# Patient Record
Sex: Male | Born: 1945
Health system: Southern US, Community
[De-identification: ages and names within clinical notes are randomized; demographics above are authoritative.]

## PROBLEM LIST (undated history)

## (undated) DIAGNOSIS — T7840XA Allergy, unspecified, initial encounter: Secondary | ICD-10-CM

## (undated) DIAGNOSIS — I639 Cerebral infarction, unspecified: Secondary | ICD-10-CM

## (undated) DIAGNOSIS — H409 Unspecified glaucoma: Secondary | ICD-10-CM

## (undated) DIAGNOSIS — F329 Major depressive disorder, single episode, unspecified: Secondary | ICD-10-CM

## (undated) DIAGNOSIS — I10 Essential (primary) hypertension: Secondary | ICD-10-CM

## (undated) DIAGNOSIS — F32A Depression, unspecified: Secondary | ICD-10-CM

## (undated) DIAGNOSIS — E119 Type 2 diabetes mellitus without complications: Secondary | ICD-10-CM

## (undated) DIAGNOSIS — M1990 Unspecified osteoarthritis, unspecified site: Secondary | ICD-10-CM

## (undated) HISTORY — PX: TONSILLECTOMY: SUR1361

## (undated) HISTORY — DX: Allergy, unspecified, initial encounter: T78.40XA

## (undated) HISTORY — DX: Depression, unspecified: F32.A

## (undated) HISTORY — DX: Major depressive disorder, single episode, unspecified: F32.9

## (undated) HISTORY — DX: Unspecified glaucoma: H40.9

## (undated) HISTORY — DX: Unspecified osteoarthritis, unspecified site: M19.90

---

## 2012-10-07 ENCOUNTER — Encounter (HOSPITAL_COMMUNITY): Payer: Self-pay | Admitting: *Deleted

## 2012-10-07 ENCOUNTER — Emergency Department (HOSPITAL_COMMUNITY)
Admission: EM | Admit: 2012-10-07 | Discharge: 2012-10-07 | Disposition: A | Discharge status: Home / Self Care | Payer: Medicare Other | Attending: Emergency Medicine | Admitting: Emergency Medicine

## 2012-10-07 DIAGNOSIS — L608 Other nail disorders: Secondary | ICD-10-CM | POA: Insufficient documentation

## 2012-10-07 DIAGNOSIS — Z79899 Other long term (current) drug therapy: Secondary | ICD-10-CM | POA: Insufficient documentation

## 2012-10-07 DIAGNOSIS — Z7982 Long term (current) use of aspirin: Secondary | ICD-10-CM | POA: Insufficient documentation

## 2012-10-07 DIAGNOSIS — Z760 Encounter for issue of repeat prescription: Secondary | ICD-10-CM

## 2012-10-07 DIAGNOSIS — B351 Tinea unguium: Secondary | ICD-10-CM | POA: Insufficient documentation

## 2012-10-07 HISTORY — DX: Type 2 diabetes mellitus without complications: E11.9

## 2012-10-07 HISTORY — DX: Essential (primary) hypertension: I10

## 2012-10-07 HISTORY — DX: Cerebral infarction, unspecified: I63.9

## 2012-10-07 MED ORDER — LISINOPRIL 40 MG PO TABS: 40.0000 mg | ORAL_TABLET | Freq: Every day | ORAL | Status: DC

## 2012-10-07 MED ORDER — PAROXETINE HCL 20 MG PO TABS: 20.0000 mg | ORAL_TABLET | ORAL | Status: DC

## 2012-10-07 NOTE — ED Notes (Signed)
MD at bedside. 

## 2012-10-07 NOTE — ED Provider Notes (Signed)
History     CSN: [id number]  Arrival date & time 10/07/12  1106   First MD Initiated Contact with Patient 10/07/12 1121      No chief complaint on file.   (Consider location/radiation/quality/duration/timing/severity/associated sxs/prior treatment) HPI  66 year old male presents to the ED requesting for medication refill. Patient reports she lives down in [place], recently lost his house and has to move to [place] to live with his grandchildren.  For the past week he has been out of his regular medication, lisinopril 40 mg daily and Paxil 20 mg daily. Patient states he does notice a change in his personality however denies any SI/HI, or hallucination the patient has no other complaints physically no headache, no chest pain, shortness of breath, abdominal pain, numbness or weakness.  Patient also requests to have his toenails trim because it has been hurting him whenever he walked. He has not been able to trim his nails for many years. he is a borderline diabetic and takes no medication.  No past medical history on file.  No past surgical history on file.  No family history on file.  History  Substance Use Topics  . Smoking status: Not on file  . Smokeless tobacco: Not on file  . Alcohol Use: Not on file      Review of Systems  Constitutional: Negative for fever.  Respiratory: Negative for shortness of breath.   Cardiovascular: Negative for chest pain.  Skin: Negative for rash.  Neurological: Negative for headaches.  Psychiatric/Behavioral: Negative for suicidal ideas, hallucinations and confusion.    Allergies  Review of patient's allergies indicates no known allergies.  Home Medications   Current Outpatient Rx  Name  Route  Sig  Dispense  Refill  . ASPIRIN 81 MG PO TABS   Oral   Take 81 mg by mouth daily.         Marland Kitchen LISINOPRIL 40 MG PO TABS   Oral   Take 40 mg by mouth daily.         Marland Kitchen PAROXETINE HCL 20 MG PO TABS   Oral   Take 20 mg by mouth  every morning.           There were no vitals taken for this visit.  Physical Exam  Nursing note and vitals reviewed. Constitutional: He appears well-developed and well-nourished. No distress.       Awake, alert, nontoxic appearance  HENT:  Head: Atraumatic.  Eyes: Conjunctivae normal are normal. Right eye exhibits no discharge. Left eye exhibits no discharge.  Neck: Normal range of motion. Neck supple.  Cardiovascular: Normal rate and regular rhythm.   Pulmonary/Chest: Effort normal. No respiratory distress. He exhibits no tenderness.  Abdominal: Soft. There is no tenderness. There is no rebound.  Musculoskeletal: He exhibits no tenderness.       ROM appears intact, no obvious focal weakness  Toenails unkept, long, evidence of fungal infection.    Neurological: He is alert.  Skin: Skin is warm and dry. No rash noted.  Psychiatric: He has a normal mood and affect.    ED Course  Procedures (including critical care time)  Labs Reviewed - No data to display No results found.   No diagnosis found.  1. Medication refill 2. Trim toenails.   MDM   Pt is here for medication refill.  Will give resource for local doctor.  Will refill lisinopril and paxil.    Toenails trimmed here by me using rib cutter and wire cutter as tool. Estimate procedure  time: .  Pt tolerates procedure without difficulty.  Will refer to Foot specialist for further care.  Care discussed with my attending.    12:17 PM BP is elevated today, 2/2 not taking his HTN meds for 1 week.  Pt to resume his meds treatment.  Resource given.    BP 187/98  Pulse 82  Temp 98.7 F (37.1 C) (Oral)  Resp 18  SpO2 100%  I have reviewed nursing notes and vital signs.  I reviewed available ER/hospitalization records thought the EMR         Fayrene Helper, [place] 10/07/12 1218

## 2012-10-07 NOTE — ED Provider Notes (Signed)
Medical screening examination/treatment/procedure(s) were performed by non-physician practitioner and as supervising physician I was immediately available for consultation/collaboration.   Lyanne Co, MD 10/07/12 [phone]

## 2012-10-07 NOTE — ED Notes (Signed)
Pt states he has been out of his medication x 1 week, lisinopril and paroxetine, states "i don't feel good". Pt also states he needs his toe nails cut.

## 2013-04-24 ENCOUNTER — Inpatient Hospital Stay (HOSPITAL_COMMUNITY)
Admission: EM | Admit: 2013-04-24 | Discharge: 2013-04-28 | DRG: 065 | Disposition: A | Discharge status: Rehabilitation Facility | Payer: Medicare Other | Attending: Internal Medicine | Admitting: Internal Medicine

## 2013-04-24 ENCOUNTER — Encounter (HOSPITAL_COMMUNITY): Payer: Self-pay | Admitting: *Deleted

## 2013-04-24 ENCOUNTER — Emergency Department (HOSPITAL_COMMUNITY): Payer: Medicare Other

## 2013-04-24 DIAGNOSIS — I10 Essential (primary) hypertension: Secondary | ICD-10-CM | POA: Diagnosis present

## 2013-04-24 DIAGNOSIS — Z8673 Personal history of transient ischemic attack (TIA), and cerebral infarction without residual deficits: Secondary | ICD-10-CM

## 2013-04-24 DIAGNOSIS — D72829 Elevated white blood cell count, unspecified: Secondary | ICD-10-CM | POA: Diagnosis present

## 2013-04-24 DIAGNOSIS — F0150 Vascular dementia without behavioral disturbance: Secondary | ICD-10-CM | POA: Diagnosis present

## 2013-04-24 DIAGNOSIS — F3289 Other specified depressive episodes: Secondary | ICD-10-CM | POA: Diagnosis present

## 2013-04-24 DIAGNOSIS — R066 Hiccough: Secondary | ICD-10-CM | POA: Diagnosis present

## 2013-04-24 DIAGNOSIS — E876 Hypokalemia: Secondary | ICD-10-CM | POA: Diagnosis present

## 2013-04-24 DIAGNOSIS — I7090 Unspecified atherosclerosis: Secondary | ICD-10-CM | POA: Diagnosis present

## 2013-04-24 DIAGNOSIS — R4701 Aphasia: Secondary | ICD-10-CM | POA: Diagnosis present

## 2013-04-24 DIAGNOSIS — W19XXXA Unspecified fall, initial encounter: Secondary | ICD-10-CM | POA: Diagnosis present

## 2013-04-24 DIAGNOSIS — R5383 Other fatigue: Secondary | ICD-10-CM | POA: Diagnosis present

## 2013-04-24 DIAGNOSIS — Z7982 Long term (current) use of aspirin: Secondary | ICD-10-CM

## 2013-04-24 DIAGNOSIS — E1149 Type 2 diabetes mellitus with other diabetic neurological complication: Secondary | ICD-10-CM | POA: Diagnosis present

## 2013-04-24 DIAGNOSIS — Z79899 Other long term (current) drug therapy: Secondary | ICD-10-CM

## 2013-04-24 DIAGNOSIS — F17200 Nicotine dependence, unspecified, uncomplicated: Secondary | ICD-10-CM | POA: Diagnosis present

## 2013-04-24 DIAGNOSIS — R262 Difficulty in walking, not elsewhere classified: Secondary | ICD-10-CM | POA: Diagnosis present

## 2013-04-24 DIAGNOSIS — I6350 Cerebral infarction due to unspecified occlusion or stenosis of unspecified cerebral artery: Principal | ICD-10-CM | POA: Diagnosis present

## 2013-04-24 DIAGNOSIS — H409 Unspecified glaucoma: Secondary | ICD-10-CM | POA: Diagnosis present

## 2013-04-24 DIAGNOSIS — R5381 Other malaise: Secondary | ICD-10-CM | POA: Diagnosis present

## 2013-04-24 DIAGNOSIS — F329 Major depressive disorder, single episode, unspecified: Secondary | ICD-10-CM | POA: Diagnosis present

## 2013-04-24 DIAGNOSIS — I639 Cerebral infarction, unspecified: Secondary | ICD-10-CM

## 2013-04-24 LAB — URINALYSIS, ROUTINE W REFLEX MICROSCOPIC
Leukocytes, UA: NEGATIVE
Nitrite: NEGATIVE
Specific Gravity, Urine: 1.032 — ABNORMAL HIGH (ref 1.005–1.030)
Urobilinogen, UA: 1 mg/dL (ref 0.0–1.0)
pH: 5 (ref 5.0–8.0)

## 2013-04-24 LAB — CBC
HCT: 47.1 % (ref 39.0–52.0)
MCV: 87.2 fL (ref 78.0–100.0)
RBC: 5.4 MIL/uL (ref 4.22–5.81)
WBC: 17.3 10*3/uL — ABNORMAL HIGH (ref 4.0–10.5)

## 2013-04-24 LAB — COMPREHENSIVE METABOLIC PANEL
ALT: 19 U/L (ref 0–53)
CO2: 23 mEq/L (ref 19–32)
Calcium: 10.2 mg/dL (ref 8.4–10.5)
Chloride: 95 mEq/L — ABNORMAL LOW (ref 96–112)
Creatinine, Ser: 1.1 mg/dL (ref 0.50–1.35)
GFR calc Af Amer: 77 mL/min — ABNORMAL LOW (ref >90)
GFR calc non Af Amer: 67 mL/min — ABNORMAL LOW (ref >90)
Glucose, Bld: 135 mg/dL — ABNORMAL HIGH (ref 70–99)
Sodium: 136 mEq/L (ref 135–145)
Total Bilirubin: 1 mg/dL (ref 0.3–1.2)

## 2013-04-24 LAB — CK: Total CK: 631 U/L — ABNORMAL HIGH (ref 7–232)

## 2013-04-24 LAB — URINE MICROSCOPIC-ADD ON

## 2013-04-24 LAB — POCT I-STAT, CHEM 8
Glucose, Bld: 137 mg/dL — ABNORMAL HIGH (ref 70–99)
HCT: 53 % — ABNORMAL HIGH (ref 39.0–52.0)
Hemoglobin: 18 g/dL — ABNORMAL HIGH (ref 13.0–17.0)
Potassium: 3.9 mEq/L (ref 3.5–5.1)

## 2013-04-24 LAB — DIFFERENTIAL
Eosinophils Relative: 1 % (ref 0–5)
Lymphocytes Relative: 15 % (ref 12–46)
Lymphs Abs: 2.6 10*3/uL (ref 0.7–4.0)
Monocytes Absolute: 2 10*3/uL — ABNORMAL HIGH (ref 0.1–1.0)

## 2013-04-24 LAB — RAPID URINE DRUG SCREEN, HOSP PERFORMED
Barbiturates: NOT DETECTED
Cocaine: NOT DETECTED

## 2013-04-24 MED ORDER — LABETALOL HCL 5 MG/ML IV SOLN
20.0000 mg | Freq: Once | INTRAVENOUS | Status: AC
  Administered 2013-04-24: 20 mg via INTRAVENOUS
  Filled 2013-04-24: qty 4

## 2013-04-24 MED ORDER — SODIUM CHLORIDE 0.9 % IV BOLUS (SEPSIS)
1000.0000 mL | Freq: Once | INTRAVENOUS | Status: AC
Start: 2013-04-24 — End: 2013-04-24
  Administered 2013-04-24: 1000 mL via INTRAVENOUS

## 2013-04-24 NOTE — H&P (Signed)
[facility] History and Physical  Mccade Sullenberger ZOX:[id number] DOB: [birth year] DOA: 04/24/2013  Referring physician: ER physician PCP: No primary provider on file.   Chief Complaint: difficulty speaking   HPI:  67 year old male with past medical history of CVA, hypertension who presented to [facility] ED at the concern of his family that has found him lying in bed, weak and with difficulty expressing himself. In ED, patient remains aphasic but is awake and alert. Patient did report he fell at home. No current complaints of pain but he reports being weak. No chest pain, no shortness of breath and no palpitations. No abdominal pain, no nausea or vomiting. No lightheadedness. No reports of loss of consciousness. No loss of sensation. No blurry vision. No headaches. No fever or chills. In ED, BP is elevated at 179/109 and then 196/118 however no blood pressure meds have been given while in ED.  HR was 94, T max 99.2 F and O2 saturation 95% while breathing room air. CT head revealed atrophy with small vessel chronic ischemic white matter changes, old bilateral basal ganglia lacunar infarcts. No acute intracranial abnormalities.  CXR revealed soft tissue fullness in the right hilum (may need further evaluation with CT chest. CBC revealed leukocytosis of 17.4 and hemoglobin of 18. CK was elevated at 631 and troponin was WNL.  Assessment and Plan:  Principal Problem:   CVA (cerebral infarction) - CVA order set in place - follow up MRI brain - follow up TSH, lipid panel, A1c - follow up 2 D ECHO and carotid doppler - may use aspirin Active Problems:   Accelerated hypertension - given labetalol 20 mg IV in ED - restart lisinopril per home regimen   Leukocytosis - unclear etiology - no fevers - urinalysis negative - will continue to monitor; will defer antibiotic treatment for now as no clear source of infection identified at this point   Depression - continue paxil   Elevated CK enzyme - likely  due to prolonged immobility and trauma - continue IV fluids - check CK in am  Manson Passey [facility] [phone]  Review of Systems:  Constitutional: Negative for fever, chills and malaise/fatigue. Negative for diaphoresis.  HENT: Negative for hearing loss, ear pain, nosebleeds, congestion, sore throat, neck pain, tinnitus and ear discharge.   Eyes: Negative for blurred vision, double vision, photophobia, pain, discharge and redness.  Respiratory: Negative for cough, hemoptysis, sputum production, shortness of breath, wheezing and stridor.   Cardiovascular: Negative for chest pain, palpitations, orthopnea, claudication and leg swelling.  Gastrointestinal: Negative for nausea, vomiting and abdominal pain. Negative for heartburn, constipation, blood in stool and melena.  Genitourinary: Negative for dysuria, urgency, frequency, hematuria and flank pain.  Musculoskeletal: Negative for myalgias, back pain, joint pain and falls.  Skin: Negative for itching and rash.  Neurological: per HPI.  Endo/Heme/Allergies: Negative for environmental allergies and polydipsia. Does not bruise/bleed easily.  Psychiatric/Behavioral: Negative for suicidal ideas. The patient is not nervous/anxious.      Past Medical History  Diagnosis Date  . Stroke   . Diabetes mellitus without complication   . Hypertension    Past Surgical History  Procedure Laterality Date  . Tonsillectomy     Social History:  reports that he has been smoking.  He has never used smokeless tobacco. He reports that he does not drink alcohol or use illicit drugs.  No Known Allergies  Family History: Family medical history significant for HTN, HLD   Prior to Admission medications   Medication Sig Start Date  End Date Taking? Authorizing Provider  aspirin EC 81 MG tablet Take 81 mg by mouth daily.   Yes Historical Provider, MD  lisinopril (PRINIVIL,ZESTRIL) 40 MG tablet Take 1 tablet (40 mg total) by mouth daily. 10/07/12  Yes Fayrene Helper, PA-C   PARoxetine (PAXIL) 20 MG tablet Take 1 tablet (20 mg total) by mouth every morning. 10/07/12  Yes Fayrene Helper, PA-C   Physical Exam: Filed Vitals:   04/24/13 1956 04/24/13 2000 04/24/13 2041  BP: 196/118 179/109 195/105  Pulse: 103 102 102  Temp: 99.2 F (37.3 C)    TempSrc: Rectal    Resp: 21 20 23   SpO2: 95% 95% 95%    Physical Exam  Constitutional: Appears well-developed and well-nourished. No distress.  HENT: Normocephalic. No tonsillar erythema or exudates Eyes: Conjunctivae  are normal. PERRLA, no scleral icterus.  Neck: Normal ROM. Neck supple. No JVD. No tracheal deviation. No thyromegaly.  CVS: RRR, S1/S2 +, no murmurs, no gallops, no carotid bruit.  Pulmonary: Effort and breath sounds normal, no stridor, rhonchi, wheezes, rales.  Abdominal: Soft. BS +,  no distension, tenderness, rebound or guarding.  Musculoskeletal: Normal range of motion. No edema and no tenderness.  Lymphadenopathy: No lymphadenopathy noted, cervical, inguinal. Neuro: Alert. Difficult to understand his speech, left lateral gaze palsy Skin: Skin is warm and dry. No rash noted. Not diaphoretic. No erythema. No pallor.  Psychiatric: Normal mood and affect. Behavior, judgment, thought content normal.   Labs on Admission:  Basic Metabolic Panel:  Recent Labs Lab 04/24/13 2022 04/24/13 2027  NA 136 140  K 3.9 3.9  CL 95* 103  CO2 23  --   GLUCOSE 135* 137*  BUN 21 23  CREATININE 1.10 1.10  CALCIUM 10.2  --    Liver Function Tests:  Recent Labs Lab 04/24/13 2022  AST 27  ALT 19  ALKPHOS 171*  BILITOT 1.0  PROT 9.0*  ALBUMIN 4.4   No results found for this basename: LIPASE, AMYLASE,  in the last 168 hours No results found for this basename: AMMONIA,  in the last 168 hours CBC:  Recent Labs Lab 04/24/13 2022 04/24/13 2027  WBC 17.3*  --   NEUTROABS 12.5*  --   HGB 16.2 18.0*  HCT 47.1 53.0*  MCV 87.2  --   PLT 417*  --    Cardiac Enzymes:  Recent Labs Lab 04/24/13 2022   CKTOTAL 631*  TROPONINI <0.30   BNP: No components found with this basename: POCBNP,  CBG:  Recent Labs Lab 04/24/13 1951  GLUCAP 111*    Radiological Exams on Admission: Ct Head Wo Contrast 04/24/2013   * IMPRESSION: Atrophy with small vessel chronic ischemic changes of deep cerebral white matter. Old bilateral basal ganglia lacunar infarcts. No acute intracranial abnormalities.   Original Report Authenticated By: Ulyses Southward, M.D.   Dg Chest Port 1 View 04/24/2013   *  IMPRESSION: Soft tissue fullness in the right hilum.  Findings could be vascular in etiology but cannot exclude lymphadenopathy. Recommend further evaluation with a two-view chest exam or chest CT.  No focal lung disease.   Original Report Authenticated By: Richarda Overlie, M.D.    Code Status: Full Family Communication: Pt at bedside Disposition Plan: Admit for further evaluation  Manson Passey, MD  [facility] Pager [phone]  If 7PM-7AM, please contact night-coverage [email or web address] Password [email or web address] 04/24/2013, 10:08 PM

## 2013-04-24 NOTE — ED Provider Notes (Addendum)
History    CSN: [id number] Arrival date & time 04/24/13  1947  First MD Initiated Contact with Patient 04/24/13 1954    level 5 aphasia Chief Complaint  Patient presents with  . Weakness  . Fall   (Consider location/radiation/quality/duration/timing/severity/associated sxs/prior Treatment) HPI  67 year old male with a history of stroke and diabetes whose family found him in his bed today unable to get up and had urinated in the bed. They had last spoken to him on Wednesday. That is 2 days prior to evaluation. They called him today and did not get an answer and eventually went over to check on him. He was found in his bed and he was unable to get up. He was taken to the car with help from his family members. They state that even after he was upright he was unable to walk on his own due to what appeared to be generalized weakness. He has been having difficulty speaking. He is generally weak. His prior stroke did not have known lateralized weakness. He has been ambulatory. He has been taking care of himself. His son was last in contact with him 2 days ago when he was at his normal state. Patient is unable to speak and give me a clear history. He does not appear to have any complaints of pain. Past Medical History  Diagnosis Date  . Stroke   . Diabetes mellitus without complication   . Hypertension    Past Surgical History  Procedure Laterality Date  . Tonsillectomy     History reviewed. No pertinent family history. History  Substance Use Topics  . Smoking status: Current Every Day Smoker  . Smokeless tobacco: Never Used  . Alcohol Use: No    Review of Systems  Unable to perform ROS   Allergies  Review of patient's allergies indicates no known allergies.  Home Medications   Current Outpatient Rx  Name  Route  Sig  Dispense  Refill  . aspirin EC 81 MG tablet   Oral   Take 81 mg by mouth daily.         Marland Kitchen lisinopril (PRINIVIL,ZESTRIL) 40 MG tablet   Oral   Take 1 tablet  (40 mg total) by mouth daily.   30 tablet   1   . PARoxetine (PAXIL) 20 MG tablet   Oral   Take 1 tablet (20 mg total) by mouth every morning.   30 tablet   0    BP 195/105  Pulse 102  Temp(Src) 99.2 F (37.3 C) (Rectal)  Resp 23  SpO2 95% Physical Exam  Nursing note and vitals reviewed. Constitutional: He appears well-developed and well-nourished.  HENT:  Head: Normocephalic and atraumatic.  Mucous membranes are dry  Eyes: Pupils are equal, round, and reactive to light.  Left lateral gaze palsy  Neck: Normal range of motion. Neck supple.  Cardiovascular: Normal rate, regular rhythm, normal heart sounds and intact distal pulses.   Pulmonary/Chest: Effort normal and breath sounds normal.  Abdominal: Soft.  Bowel sounds are decreased.  Musculoskeletal: Normal range of motion.  Neurological: He is alert. He has normal reflexes. He displays atrophy. No sensory deficit. He exhibits normal muscle tone. He displays a negative Romberg sign. GCS eye subscore is 4. GCS verbal subscore is 4. GCS motor subscore is 6.  Patient able to raise both legs against gravity unable hold for 5 seconds. No arm drift is noted.    ED Course  Procedures (including critical care time) Labs Reviewed  GLUCOSE,  CAPILLARY - Abnormal; Notable for the following:    Glucose-Capillary 111 (*)    All other components within normal limits  CBC - Abnormal; Notable for the following:    WBC 17.3 (*)    RDW 19.0 (*)    Platelets 417 (*)    All other components within normal limits  DIFFERENTIAL - Abnormal; Notable for the following:    Neutro Abs 12.5 (*)    Monocytes Absolute 2.0 (*)    All other components within normal limits  COMPREHENSIVE METABOLIC PANEL - Abnormal; Notable for the following:    Chloride 95 (*)    Glucose, Bld 135 (*)    Total Protein 9.0 (*)    Alkaline Phosphatase 171 (*)    GFR calc non Af Amer 67 (*)    GFR calc Af Amer 77 (*)    All other components within normal limits   CK - Abnormal; Notable for the following:    Total CK 631 (*)    All other components within normal limits  POCT I-STAT, CHEM 8 - Abnormal; Notable for the following:    Glucose, Bld 137 (*)    Calcium, Ion 1.11 (*)    Hemoglobin 18.0 (*)    HCT 53.0 (*)    All other components within normal limits  ETHANOL  PROTIME-INR  APTT  TROPONIN I  URINE RAPID DRUG SCREEN (HOSP PERFORMED)  URINALYSIS, ROUTINE W REFLEX MICROSCOPIC  POCT I-STAT TROPONIN I   Ct Head Wo Contrast  04/24/2013   *RADIOLOGY REPORT*  Clinical Data:  New weakness, fell 1 day ago, history of stroke, diabetes, hypertension  CT HEAD WITHOUT CONTRAST  Technique:  Contiguous axial images were obtained from the base of the skull through the vertex without contrast.  Comparison: None  Findings: Generalized atrophy. Normal ventricular morphology. No midline shift or mass effect. Small vessel chronic ischemic changes of deep cerebral white matter. Old bilateral basal ganglia lacunar infarcts. No intracranial hemorrhage, mass lesion or evidence of acute infarction. No extra-axial fluid collections. Bones and sinuses unremarkable.  IMPRESSION: Atrophy with small vessel chronic ischemic changes of deep cerebral white matter. Old bilateral basal ganglia lacunar infarcts. No acute intracranial abnormalities.   Original Report Authenticated By: Ulyses Southward, M.D.   Dg Chest Port 1 View  04/24/2013   *RADIOLOGY REPORT*  Clinical Data: Weakness.  PORTABLE CHEST - 1 VIEW  Comparison: None.  Findings: Semi upright view of the chest was obtained.  There is soft tissue fullness in the right hilum.  Heart size is normal.  No focal lung disease or edema.  The trachea is midline.  IMPRESSION: Soft tissue fullness in the right hilum.  Findings could be vascular in etiology but cannot exclude lymphadenopathy. Recommend further evaluation with a two-view chest exam or chest CT.  No focal lung disease.   Original Report Authenticated By: Richarda Overlie, M.D.   No  diagnosis found. Results for orders placed during the hospital encounter of 04/24/13  GLUCOSE, CAPILLARY      Result Value Range   Glucose-Capillary 111 (*) 70 - 99 mg/dL  ETHANOL      Result Value Range   Alcohol, Ethyl (B) <11  0 - 11 mg/dL  PROTIME-INR      Result Value Range   Prothrombin Time 13.8  11.6 - 15.2 seconds   INR 1.08  0.00 - 1.49  APTT      Result Value Range   aPTT 35  24 - 37 seconds  CBC  Result Value Range   WBC 17.3 (*) 4.0 - 10.5 K/uL   RBC 5.40  4.22 - 5.81 MIL/uL   Hemoglobin 16.2  13.0 - 17.0 g/dL   HCT 64.4  03.4 - 74.2 %   MCV 87.2  78.0 - 100.0 fL   MCH 30.0  26.0 - 34.0 pg   MCHC 34.4  30.0 - 36.0 g/dL   RDW 59.5 (*) 63.8 - 75.6 %   Platelets 417 (*) 150 - 400 K/uL  DIFFERENTIAL      Result Value Range   Neutrophils Relative % 72  43 - 77 %   Neutro Abs 12.5 (*) 1.7 - 7.7 K/uL   Lymphocytes Relative 15  12 - 46 %   Lymphs Abs 2.6  0.7 - 4.0 K/uL   Monocytes Relative 12  3 - 12 %   Monocytes Absolute 2.0 (*) 0.1 - 1.0 K/uL   Eosinophils Relative 1  0 - 5 %   Eosinophils Absolute 0.1  0.0 - 0.7 K/uL   Basophils Relative 0  0 - 1 %   Basophils Absolute 0.1  0.0 - 0.1 K/uL  COMPREHENSIVE METABOLIC PANEL      Result Value Range   Sodium 136  135 - 145 mEq/L   Potassium 3.9  3.5 - 5.1 mEq/L   Chloride 95 (*) 96 - 112 mEq/L   CO2 23  19 - 32 mEq/L   Glucose, Bld 135 (*) 70 - 99 mg/dL   BUN 21  6 - 23 mg/dL   Creatinine, Ser 4.33  0.50 - 1.35 mg/dL   Calcium 29.5  8.4 - 18.8 mg/dL   Total Protein 9.0 (*) 6.0 - 8.3 g/dL   Albumin 4.4  3.5 - 5.2 g/dL   AST 27  0 - 37 U/L   ALT 19  0 - 53 U/L   Alkaline Phosphatase 171 (*) 39 - 117 U/L   Total Bilirubin 1.0  0.3 - 1.2 mg/dL   GFR calc non Af Amer 67 (*) >90 mL/min   GFR calc Af Amer 77 (*) >90 mL/min  TROPONIN I      Result Value Range   Troponin I <0.30  <0.30 ng/mL  URINE RAPID DRUG SCREEN (HOSP PERFORMED)      Result Value Range   Opiates NONE DETECTED  NONE DETECTED   Cocaine  NONE DETECTED  NONE DETECTED   Benzodiazepines NONE DETECTED  NONE DETECTED   Amphetamines NONE DETECTED  NONE DETECTED   Tetrahydrocannabinol NONE DETECTED  NONE DETECTED   Barbiturates NONE DETECTED  NONE DETECTED  URINALYSIS, ROUTINE W REFLEX MICROSCOPIC      Result Value Range   Color, Urine AMBER (*) YELLOW   APPearance CLOUDY (*) CLEAR   Specific Gravity, Urine 1.032 (*) 1.005 - 1.030   pH 5.0  5.0 - 8.0   Glucose, UA NEGATIVE  NEGATIVE mg/dL   Hgb urine dipstick SMALL (*) NEGATIVE   Bilirubin Urine MODERATE (*) NEGATIVE   Ketones, ur NEGATIVE  NEGATIVE mg/dL   Protein, ur 30 (*) NEGATIVE mg/dL   Urobilinogen, UA 1.0  0.0 - 1.0 mg/dL   Nitrite NEGATIVE  NEGATIVE   Leukocytes, UA NEGATIVE  NEGATIVE  CK      Result Value Range   Total CK 631 (*) 7 - 232 U/L  URINE MICROSCOPIC-ADD ON      Result Value Range   Squamous Epithelial / LPF RARE  RARE   RBC / HPF 3-6  <3 RBC/hpf  POCT I-STAT, CHEM 8      Result Value Range   Sodium 140  135 - 145 mEq/L   Potassium 3.9  3.5 - 5.1 mEq/L   Chloride 103  96 - 112 mEq/L   BUN 23  6 - 23 mg/dL   Creatinine, Ser 1.61  0.50 - 1.35 mg/dL   Glucose, Bld 096 (*) 70 - 99 mg/dL   Calcium, Ion 0.45 (*) 1.13 - 1.30 mmol/L   TCO2 24  0 - 100 mmol/L   Hemoglobin 18.0 (*) 13.0 - 17.0 g/dL   HCT 40.9 (*) 81.1 - 91.4 %  POCT I-STAT TROPONIN I      Result Value Range   Troponin i, poc 0.00  0.00 - 0.08 ng/mL   Comment 3             Date: 04/24/2013  Rate: 107  Rhythm: sinus tachycardia  QRS Axis: left  Intervals: normal  ST/T Wave abnormalities: nonspecific ST/T changes  Conduction Disutrbances:nonspecific intraventricular conduction delay  Narrative Interpretation:   Old EKG Reviewed: none available   MDM  67 year old male who presents today with difficulty speaking and left sixth nerve palsy.  Clinically he appears to have had a stroke.  He also appears to have some rhabdomyolysis with total CK of 631 likely due to immobilization- no  signs of trauma. Patient receiving iv hydration and although hypertensive, no hemodynamic instability notes.     I discussed the findings and plan with the family. Patent has some improvement in his ability to speak to me but continues to be difficult to understand. Plan admission for further evaluation Hilario Quarry, MD 04/24/13 7829  Hilario Quarry, MD 04/24/13 2200

## 2013-04-24 NOTE — ED Notes (Signed)
Pt brought in via family. Pt's family states they were trying to get in contact with pt yesterday but was unable to. Pt's family went to pt's house today and pt was found in bed with weakness and pt had also urinated on himself. Pt's family states that although pt has had 8 strokes in the past pt was in control of his bodily functions. Pt at this point is alert and oriented x 2. Reoriented to time and situation. Pt also states he fell yesterday.

## 2013-04-24 NOTE — ED Notes (Signed)
Called to give report nurse unavailable will call back.  

## 2013-04-24 NOTE — ED Notes (Signed)
md aware of pt's bp

## 2013-04-25 ENCOUNTER — Inpatient Hospital Stay (HOSPITAL_COMMUNITY): Payer: Medicare Other

## 2013-04-25 DIAGNOSIS — E1149 Type 2 diabetes mellitus with other diabetic neurological complication: Secondary | ICD-10-CM

## 2013-04-25 LAB — GLUCOSE, CAPILLARY
Glucose-Capillary: 89 mg/dL (ref 70–99)
Glucose-Capillary: 76 mg/dL (ref 70–99)

## 2013-04-25 LAB — LIPID PANEL
Cholesterol: 151 mg/dL (ref 0–200)
Triglycerides: 84 mg/dL (ref <150)

## 2013-04-25 LAB — CK: Total CK: 564 U/L — ABNORMAL HIGH (ref 7–232)

## 2013-04-25 MED ORDER — INSULIN ASPART 100 UNIT/ML ~~LOC~~ SOLN
3.0000 [IU] | Freq: Three times a day (TID) | SUBCUTANEOUS | Status: DC
  Administered 2013-04-25 – 2013-04-26 (×4): 3 [IU] via SUBCUTANEOUS

## 2013-04-25 MED ORDER — CHLORPROMAZINE HCL 10 MG PO TABS
10.0000 mg | ORAL_TABLET | Freq: Once | ORAL | Status: AC
  Administered 2013-04-25: 10 mg via ORAL
  Filled 2013-04-25: qty 1

## 2013-04-25 MED ORDER — SODIUM CHLORIDE 0.9 % IV SOLN
INTRAVENOUS | Status: DC
  Administered 2013-04-25 – 2013-04-28 (×4): via INTRAVENOUS

## 2013-04-25 MED ORDER — INSULIN ASPART 100 UNIT/ML ~~LOC~~ SOLN
0.0000 [IU] | Freq: Three times a day (TID) | SUBCUTANEOUS | Status: DC
  Administered 2013-04-25: 1 [IU] via SUBCUTANEOUS

## 2013-04-25 MED ORDER — SENNOSIDES-DOCUSATE SODIUM 8.6-50 MG PO TABS
1.0000 | ORAL_TABLET | Freq: Every evening | ORAL | Status: DC | PRN
  Filled 2013-04-25: qty 1

## 2013-04-25 MED ORDER — LISINOPRIL 20 MG PO TABS
40.0000 mg | ORAL_TABLET | Freq: Every day | ORAL | Status: DC
Start: 2013-04-25 — End: 2013-04-28
  Administered 2013-04-25 – 2013-04-28 (×5): 40 mg via ORAL
  Filled 2013-04-25 (×5): qty 2

## 2013-04-25 MED ORDER — PNEUMOCOCCAL VAC POLYVALENT 25 MCG/0.5ML IJ INJ
0.5000 mL | INJECTION | INTRAMUSCULAR | Status: AC
  Administered 2013-04-26: 0.5 mL via INTRAMUSCULAR
  Filled 2013-04-25 (×2): qty 0.5

## 2013-04-25 MED ORDER — ASPIRIN EC 81 MG PO TBEC
81.0000 mg | DELAYED_RELEASE_TABLET | Freq: Every day | ORAL | Status: DC
  Administered 2013-04-25: 81 mg via ORAL
  Filled 2013-04-25 (×2): qty 1

## 2013-04-25 MED ORDER — PAROXETINE HCL 20 MG PO TABS
20.0000 mg | ORAL_TABLET | Freq: Every day | ORAL | Status: DC
  Administered 2013-04-25 – 2013-04-28 (×5): 20 mg via ORAL
  Filled 2013-04-25 (×5): qty 1

## 2013-04-25 MED ORDER — ENOXAPARIN SODIUM 40 MG/0.4ML ~~LOC~~ SOLN
40.0000 mg | SUBCUTANEOUS | Status: DC
  Administered 2013-04-25 – 2013-04-28 (×4): 40 mg via SUBCUTANEOUS
  Filled 2013-04-25 (×4): qty 0.4

## 2013-04-25 NOTE — Progress Notes (Signed)
04/25/13 1200  PT Visit Information  Last PT Received On 04/25/13  Reason Eval/Treat Not Completed Patient not medically ready;Other (comment) (BP elevated thsi am and on BR)

## 2013-04-25 NOTE — Evaluation (Signed)
Clinical/Bedside Swallow Evaluation Patient Details  Name: Ryan Brady MRN: [id number] Date of Birth: [birth year]  Today's Date: 04/25/2013 Time: 1100-1130 SLP Time Calculation (min): 30 min  Past Medical History:  Past Medical History  Diagnosis Date  . Stroke   . Diabetes mellitus without complication   . Hypertension    Past Surgical History:  Past Surgical History  Procedure Laterality Date  . Tonsillectomy     HPI:  Pt is a 67 year old male with h/o CVA and HTN, and was admitted to [facility] ED after family found him lying in bed, weak, and with difficulty expressing himself. CT head revealed atrophy, small vessel chronic ischemic WM changes, old bilateral basal ganglia lacunar infarcts, but no acute intracranial abnormalities.    Assessment / Plan / Recommendation Clinical Impression  Patient presents with a moderate oral dysphagia, caused by weakness and decreased ROM of oral-motor structures, resulting in decreased mastication, prolonged oral phase, decreased oral manipulation and oral transit of solid and puree solid textures. Patient exhibited adequate pharyngeal contraction, and laryngeal elevation, per palpation, and voice remained clear following all P.O's tested. Suspect patient may have delayed phayngeal phase of swallow, however primary imparirment appears to be in the oral phase.     Aspiration Risk  Mild    Diet Recommendation Thin liquid;Dysphagia 2 (Fine chop)   Liquid Administration via: Cup;Straw Medication Administration: Crushed with puree Supervision: Full supervision/cueing for compensatory strategies;Staff feed patient (pt needs assistance with feeding) Compensations: Slow rate;Small sips/bites;Check for pocketing;Follow solids with liquid Postural Changes and/or Swallow Maneuvers: Seated upright 90 degrees;Upright 30-60 min after meal    Other  Recommendations Oral Care Recommendations: Oral care before and after PO   Follow Up Recommendations  Inpatient  Rehab;Home health SLP (Pending progress.)    Frequency and Duration min 2x/week  2 weeks   Pertinent Vitals/Pain     SLP Swallow Goals Patient will consume recommended diet without observed clinical signs of aspiration with: Supervision/safety;Minimal assistance Patient will utilize recommended strategies during swallow to increase swallowing safety with: Supervision/safety;Moderate assistance   Swallow Study Prior Functional Status       General Date of Onset: 04/24/13 HPI: Pt is a 67 year old male with h/o CVA and HTN, and was admitted to [facility] ED after family found him lying in bed, weak, and with difficulty expressing himself. CT head revealed atrophy, small vessel chronic ischemic WM changes, old bilateral basal ganglia lacunar infarcts, but no acute intracranial abnormalities.  Type of Study: Bedside swallow evaluation Previous Swallow Assessment: N/A Diet Prior to this Study: Regular;Thin liquids Temperature Spikes Noted: No Respiratory Status: Room air History of Recent Intubation: No Behavior/Cognition: Alert;Cooperative;Requires cueing;Other (comment) (fatigued) Oral Cavity - Dentition: Poor condition;Missing dentition Self-Feeding Abilities: Needs assist Patient Positioning: Upright in bed Baseline Vocal Quality: Clear;Low vocal intensity Volitional Cough: Weak Volitional Swallow: Able to elicit    Oral/Motor/Sensory Function Overall Oral Motor/Sensory Function: Impaired Labial ROM: Within Functional Limits Labial Symmetry: Within Functional Limits Labial Strength: Reduced Labial Sensation: Reduced Lingual ROM: Reduced right;Reduced left;Other (Comment) (reduced left-right and up-down movement) Lingual Symmetry: Within Functional Limits Lingual Strength: Reduced Lingual Sensation: Reduced Facial ROM: Within Functional Limits Facial Symmetry: Within Functional Limits Facial Strength: Reduced Facial Sensation: Within Functional Limits Velum: Within Functional  Limits Mandible: Within Functional Limits   Ice Chips Ice chips: Impaired Presentation: Spoon Oral Phase Impairments: Reduced lingual movement/coordination;Impaired mastication Other Comments: Prolonged oral phase, impaired mastication and impaired lingual ROM and manipulation   Thin Liquid Thin Liquid: Impaired  Presentation: Cup;Straw Pharyngeal  Phase Impairments: Suspected delayed Swallow;Throat Clearing - Delayed Other Comments: One instance of delayed throat clear following initial sip of thin liquids via cup, however vocal quality remained clear, and no further s/s aspiration noted with subsequent cup and straw sips.    Nectar Thick Nectar Thick Liquid: Not tested   Honey Thick Honey Thick Liquid: Not tested   Puree Puree: Impaired Presentation: Spoon Oral Phase Impairments: Reduced lingual movement/coordination;Impaired anterior to posterior transit Other Comments: Questionable delayed pharyngeal phase swallow, however appears to be caused by delayed/prolonged oral phase.   Solid   GO    Solid: Impaired Oral Phase Impairments: Impaired anterior to posterior transit;Reduced lingual movement/coordination;Poor awareness of bolus Oral Phase Functional Implications: Oral residue;Other (comment) (Majorityof residuals cleared with cued sips  thin liquids. ) Other Comments: SLP cleared trace-minimal oral residuals following solid texture P.O.'s       Ryan Brady 04/25/2013,12:30 PM    Angela Nevin, MA, CCC-SLP [facility] Speech-Language Pathologist

## 2013-04-25 NOTE — Progress Notes (Addendum)
VASCULAR LAB PRELIMINARY  PRELIMINARY  PRELIMINARY  PRELIMINARY  Carotid Dopplers completed.    Preliminary report:  There is 0-39% right ICA stenosis.  Left ICA flow not visualized past the very proximal portion, question occlusion.  Technically difficult study secondary to constant hiccups and inability to keep head turned.  Vertebral artery flow is antegrade.  Shandell Giovanni, RVT 04/25/2013, 6:03 PM

## 2013-04-25 NOTE — Progress Notes (Signed)
[facility] PROGRESS NOTE  Ryan Brady EXB:[id number] DOB: 1946/02/15 DOA: 04/24/2013 PCP: No primary provider on file.  Assessment/Plan: CVA -This is the most likely diagnosis. -MRI Brain pending. -Has had prior CVAs. -Await completion of stroke w/u including ECHO/dopplers. -PT/OT/ST evals pending. -Continue ASA for now; if CVA confirmed, change to plavix for secondary stroke prevention. -LDL 92, no statin required.  HTN -Allow permissive HTN given recent CVA. -BP well controlled currently.  DM -?new diagnosis. Not on meds prior to admission. -Check A1c. -Start on a sensitive SSI.  DVT Prophylaxis -Lovenox.   Code Status: Full code Family Communication: Patient only  Disposition Plan: To be determined (suspect may need SNF given current level of deficits).   Consultants:  None   Antibiotics:  None   Subjective: Quite aphasic.  Objective: Filed Vitals:   04/24/13 2330 04/25/13 0005 04/25/13 0229 04/25/13 0545  BP: 187/93 177/87 164/81 123/61  Pulse: 83 80  84  Temp:  98.9 F (37.2 C)  97.9 F (36.6 C)  TempSrc:  Oral  Oral  Resp: 15 20  16   Height:  5\' 9"  (1.753 m)    Weight:  76.2 kg (167 lb 15.9 oz)    SpO2: 95% 97%  100%    Intake/Output Summary (Last 24 hours) at 04/25/13 1001 Last data filed at 04/25/13 0630  Gross per 24 hour  Intake    245 ml  Output      0 ml  Net    245 ml   Filed Weights   04/25/13 0005  Weight: 76.2 kg (167 lb 15.9 oz)    Exam:   General:  Awake  Cardiovascular: RRR, no M/R/G  Respiratory: CTA B  Abdomen: S/NT/ND/+BS/no masses or organomegaly noted.  Extremities: no C/C/E/+pedal pulses   Data Reviewed: Basic Metabolic Panel:  Recent Labs Lab 04/24/13 2022 04/24/13 2027  NA 136 140  K 3.9 3.9  CL 95* 103  CO2 23  --   GLUCOSE 135* 137*  BUN 21 23  CREATININE 1.10 1.10  CALCIUM 10.2  --    Liver Function Tests:  Recent Labs Lab 04/24/13 2022  AST 27  ALT 19  ALKPHOS 171*   BILITOT 1.0  PROT 9.0*  ALBUMIN 4.4   No results found for this basename: LIPASE, AMYLASE,  in the last 168 hours No results found for this basename: AMMONIA,  in the last 168 hours CBC:  Recent Labs Lab 04/24/13 2022 04/24/13 2027  WBC 17.3*  --   NEUTROABS 12.5*  --   HGB 16.2 18.0*  HCT 47.1 53.0*  MCV 87.2  --   PLT 417*  --    Cardiac Enzymes:  Recent Labs Lab 04/24/13 2022 04/25/13 0450  CKTOTAL 631* 564*  TROPONINI <0.30  --    BNP (last 3 results) No results found for this basename: PROBNP,  in the last 8760 hours CBG:  Recent Labs Lab 04/24/13 1951  GLUCAP 111*    No results found for this or any previous visit (from the past 240 hour(s)).   Studies: Dg Chest 2 View  04/25/2013   *RADIOLOGY REPORT*  Clinical Data: Stroke.  Diabetes  CHEST - 2 VIEW  Comparison: 04/24/2013  Findings: Normal heart size.  No pleural effusion or edema.  No airspace consolidation noted.  Review of the visualized osseous structures is unremarkable.  IMPRESSION:  1.  No acute cardiopulmonary abnormalities.   Original Report Authenticated By: Signa Kell, M.D.   Ct Head Wo Contrast  04/24/2013   *  RADIOLOGY REPORT*  Clinical Data:  New weakness, fell 1 day ago, history of stroke, diabetes, hypertension  CT HEAD WITHOUT CONTRAST  Technique:  Contiguous axial images were obtained from the base of the skull through the vertex without contrast.  Comparison: None  Findings: Generalized atrophy. Normal ventricular morphology. No midline shift or mass effect. Small vessel chronic ischemic changes of deep cerebral white matter. Old bilateral basal ganglia lacunar infarcts. No intracranial hemorrhage, mass lesion or evidence of acute infarction. No extra-axial fluid collections. Bones and sinuses unremarkable.  IMPRESSION: Atrophy with small vessel chronic ischemic changes of deep cerebral white matter. Old bilateral basal ganglia lacunar infarcts. No acute intracranial abnormalities.   Original  Report Authenticated By: Ulyses Southward, M.D.   Dg Chest Port 1 View  04/24/2013   *RADIOLOGY REPORT*  Clinical Data: Weakness.  PORTABLE CHEST - 1 VIEW  Comparison: None.  Findings: Semi upright view of the chest was obtained.  There is soft tissue fullness in the right hilum.  Heart size is normal.  No focal lung disease or edema.  The trachea is midline.  IMPRESSION: Soft tissue fullness in the right hilum.  Findings could be vascular in etiology but cannot exclude lymphadenopathy. Recommend further evaluation with a two-view chest exam or chest CT.  No focal lung disease.   Original Report Authenticated By: Richarda Overlie, M.D.    Scheduled Meds: . aspirin EC  81 mg Oral Daily  . insulin aspart  0-9 Units Subcutaneous TID WC  . insulin aspart  3 Units Subcutaneous TID WC  . lisinopril  40 mg Oral Daily  . PARoxetine  20 mg Oral Daily  . [START ON 04/26/2013] pneumococcal 23 valent vaccine  0.5 mL Intramuscular Tomorrow-1000   Continuous Infusions: . sodium chloride 75 mL/hr at 04/25/13 1610    Principal Problem:   CVA (cerebral infarction) Active Problems:   Accelerated hypertension   Leukocytosis   Depression   Type II or unspecified type diabetes mellitus with neurological manifestations, not stated as uncontrolled(250.60)    Time spent: 35 minutes.    Chaya Jan  [facility] Pager [phone]  If 7PM-7AM, please contact night-coverage at [email or web address], password [facility] 04/25/2013, 10:01 AM  LOS: 1 day

## 2013-04-26 DIAGNOSIS — E876 Hypokalemia: Secondary | ICD-10-CM

## 2013-04-26 DIAGNOSIS — I379 Nonrheumatic pulmonary valve disorder, unspecified: Secondary | ICD-10-CM

## 2013-04-26 LAB — BASIC METABOLIC PANEL
BUN: 17 mg/dL (ref 6–23)
CO2: 25 mEq/L (ref 19–32)
Calcium: 8.4 mg/dL (ref 8.4–10.5)
Chloride: 104 mEq/L (ref 96–112)
Creatinine, Ser: 1.16 mg/dL (ref 0.50–1.35)
GFR calc Af Amer: 72 mL/min — ABNORMAL LOW (ref >90)
GFR calc non Af Amer: 62 mL/min — ABNORMAL LOW (ref >90)
Glucose, Bld: 89 mg/dL (ref 70–99)
Potassium: 3.3 mEq/L — ABNORMAL LOW (ref 3.5–5.1)
Sodium: 136 mEq/L (ref 135–145)

## 2013-04-26 LAB — CBC
HCT: 35 % — ABNORMAL LOW (ref 39.0–52.0)
Hemoglobin: 11.5 g/dL — ABNORMAL LOW (ref 13.0–17.0)
MCH: 28.8 pg (ref 26.0–34.0)
MCHC: 32.9 g/dL (ref 30.0–36.0)
MCV: 87.5 fL (ref 78.0–100.0)
Platelets: 259 10*3/uL (ref 150–400)
RBC: 4 MIL/uL — ABNORMAL LOW (ref 4.22–5.81)
RDW: 19.2 % — ABNORMAL HIGH (ref 11.5–15.5)
WBC: 11.4 10*3/uL — ABNORMAL HIGH (ref 4.0–10.5)

## 2013-04-26 LAB — GLUCOSE, CAPILLARY
Glucose-Capillary: 96 mg/dL (ref 70–99)
Glucose-Capillary: 69 mg/dL — ABNORMAL LOW (ref 70–99)

## 2013-04-26 LAB — HEMOGLOBIN A1C: Mean Plasma Glucose: 114 mg/dL (ref <117)

## 2013-04-26 LAB — MAGNESIUM: Magnesium: 2.2 mg/dL (ref 1.5–2.5)

## 2013-04-26 MED ORDER — POTASSIUM CHLORIDE CRYS ER 20 MEQ PO TBCR
40.0000 meq | EXTENDED_RELEASE_TABLET | Freq: Once | ORAL | Status: AC
  Administered 2013-04-26: 40 meq via ORAL
  Filled 2013-04-26: qty 2

## 2013-04-26 MED ORDER — CHLORPROMAZINE HCL 25 MG/ML IJ SOLN
25.0000 mg | Freq: Three times a day (TID) | INTRAMUSCULAR | Status: DC | PRN
  Administered 2013-04-26 – 2013-04-28 (×2): 25 mg via INTRAMUSCULAR
  Filled 2013-04-26 (×2): qty 1

## 2013-04-26 MED ORDER — CLOPIDOGREL BISULFATE 75 MG PO TABS
75.0000 mg | ORAL_TABLET | Freq: Every day | ORAL | Status: DC
  Administered 2013-04-27 – 2013-04-28 (×2): 75 mg via ORAL
  Filled 2013-04-26 (×3): qty 1

## 2013-04-26 NOTE — Evaluation (Signed)
Physical Therapy Evaluation Patient Details Name: Ryan Brady MRN: [id number] DOB: 02-07-1946 Today's Date: 04/26/2013 Time: 7829-5621 PT Time Calculation (min): 27 min  PT Assessment / Plan / Recommendation History of Present Illness  Pt with acute R MCA CVA with L sided weakness (UE/grip > LLE) and some L sided inattention.  Was indpenedent PTA without use of AD.  Lives in apartment with son/daughter in law close by.  Unsure if they are able to provide 24/7 assist at this time.    Clinical Impression  Pt with acute R MCA CVA with some residual L sided weakness and some expressive aphasia.  Pt able to get OOB and ambulate in hallway with and without RW, however requires Mod assist without RW at this time.  Feel pt will benefit from skilled PT in acute venue to address deficits.  PT recommends CIR for follow up at D/C to maximize pts safety and function.      PT Assessment  Patient needs continued PT services    Follow Up Recommendations   CIR    Does the patient have the potential to tolerate intense rehabilitation      Barriers to Discharge Decreased caregiver support      Equipment Recommendations   (TBD)    Recommendations for Other Services OT consult   Frequency Min 4X/week    Precautions / Restrictions Precautions Precautions: Fall Precaution Comments: L sided weakness (UE >LE) and some mild L sided inattention.  Educated family to sit and converse from pts left side.    Restrictions Weight Bearing Restrictions: No   Pertinent Vitals/Pain No pain      Mobility  Bed Mobility Bed Mobility: Supine to Sit Supine to Sit: 5: Supervision;HOB elevated;With rails Details for Bed Mobility Assistance: Requires increased time and use of rails to complete task.  Provided min cues for scooting to EOB once in sitting position.  Transfers Transfers: Sit to Stand;Stand to Sit Sit to Stand: 4: Min assist;From elevated surface;From bed Stand to Sit: 4: Min guard;With upper  extremity assist;With armrests;To chair/3-in-1 Details for Transfer Assistance: Assist to rise and steady with cues for hand placement as pt wanted to pull up on RW to stand.  Ambulation/Gait Ambulation/Gait Assistance: 3: Mod assist Ambulation Distance (Feet): 150 Feet (then another 100') Assistive device: Rolling walker;None Ambulation/Gait Assistance Details: Ambulated initially with  RW for safety and noted that pt tends to list to the left with ambulation.  He was able to self correct, however did require increased time.  He was also able to state that he felt himself going left.  Also note that he tends to keep his head and eyes focused on the right with ambulation and when RW taken away to reassess gait, he would almost turn sideways towards R while ambulating.  Did not notice any foot drop or steppage gait, however did note decreased swing phase on R side.     Gait Pattern: Step-through pattern;Decreased step length - right;Ataxic Gait velocity: decreased General Gait Details: mild ataxia    Exercises     PT Diagnosis: Difficulty walking;Generalized weakness;Abnormality of gait  PT Problem List: Decreased strength;Decreased activity tolerance;Decreased balance;Decreased mobility;Decreased coordination;Decreased cognition;Decreased knowledge of use of DME;Decreased safety awareness;Decreased knowledge of precautions PT Treatment Interventions: DME instruction;Functional mobility training;Therapeutic activities;Therapeutic exercise;Balance training;Gait training;Neuromuscular re-education;Patient/family education     PT Goals(Current goals can be found in the care plan section) Acute Rehab PT Goals Patient Stated Goal: n/a PT Goal Formulation: With patient/family Time For Goal Achievement: 05/03/13 Potential  to Achieve Goals: Good  Visit Information  Last PT Received On: 04/26/13 Assistance Needed: +1 History of Present Illness: Pt with acute R MCA CVA with L sided weakness  (UE/grip > LLE) and some L sided inattention.  Was indpenedent PTA without use of AD.  Lives in apartment with son/daughter in law close by.  Unsure if they are able to provide 24/7 assist at this time.         Prior Functioning  Home Living Family/patient expects to be discharged to:: Private residence Living Arrangements: Alone Available Help at Discharge: Family;Available PRN/intermittently Type of Home: Apartment Home Access: Level entry Home Layout: One level Home Equipment: None Prior Function Level of Independence: Independent Communication Communication: Expressive difficulties (soft spoken, some garbled language, mostly understandable. ) Dominant Hand: Right    Cognition  Cognition Arousal/Alertness: Awake/alert Behavior During Therapy: Flat affect Overall Cognitive Status: Impaired/Different from baseline Area of Impairment: Orientation;Attention;Awareness Orientation Level: Disoriented to;Place Current Attention Level: Sustained Awareness: Intellectual    Extremity/Trunk Assessment Upper Extremity Assessment Upper Extremity Assessment: LUE deficits/detail LUE Deficits / Details: Did note some decreased grip strength in L hand Lower Extremity Assessment Lower Extremity Assessment: LLE deficits/detail LLE Deficits / Details: Pt with 5/5 strength in knee flex/ext and ankle PF/DF, note 4/5 strength at hip flex LLE Sensation: decreased light touch (somewhat decreased sensation at L3/4 level dermatomes)   Balance    End of Session PT - End of Session Equipment Utilized During Treatment: Gait belt Activity Tolerance: Patient tolerated treatment well Patient left: in chair;with call bell/phone within reach;with family/visitor present Nurse Communication: Mobility status  GP     Vista Deck 04/26/2013, 11:07 AM

## 2013-04-26 NOTE — Progress Notes (Addendum)
[facility] PROGRESS NOTE  Ryan Brady JYN:[id number] DOB: May 30, 1946 DOA: 04/24/2013 PCP: No primary provider on file.  Assessment/Plan: CVA -MRI confirms an acute right MCA CVA. -Has had prior CVAs. -MRA with high-grade stenosis of M1 segment of the left MCA. This has been discussed via phone with neurology, Dr. Loretha Brasil, and has been decided that no further intervention required (opposite side of current CVA). -Carotid Dopplers: There is 0-39% right ICA stenosis. Left ICA flow not visualized past the very proximal portion, question occlusion. Technically difficult study secondary to constant hiccups and inability to keep head turned. Vertebral artery flow is antegrade. -2D ECHO pending. -PT/OT/ evals pending. -ASA has been upgraded to plavix for secondary stroke prevention. -LDL 92, no statin required.  HTN -Allow permissive HTN given recent CVA. -BP well controlled currently.  DM -?new diagnosis. Not on meds prior to admission. -Check A1c. -Start on a sensitive SSI. -Well controlled.  Hypokalemia -Replete PO. -Check Mg.  DVT Prophylaxis -Lovenox.   Code Status: Full code Family Communication: Patient only  Disposition Plan: To be determined (suspect may need SNF given current level of deficits).   Consultants:  None   Antibiotics:  None   Subjective: No new events/complaints.  Objective: Filed Vitals:   04/25/13 1438 04/25/13 1810 04/25/13 2114 04/26/13 0657  BP: 129/65 124/58 132/60 140/71  Pulse: 69 79 71 71  Temp: 97.9 F (36.6 C) 97.6 F (36.4 C) 98.8 F (37.1 C) 98.6 F (37 C)  TempSrc: Oral Oral Oral Oral  Resp: 18 18 20 20   Height:      Weight:      SpO2: 98% 100% 99% 100%    Intake/Output Summary (Last 24 hours) at 04/26/13 1302 Last data filed at 04/26/13 0700  Gross per 24 hour  Intake   2400 ml  Output   1100 ml  Net   1300 ml   Filed Weights   04/25/13 0005  Weight: 76.2 kg (167 lb 15.9 oz)    Exam:   General:   Awake  Cardiovascular: RRR, no M/R/G  Respiratory: CTA B  Abdomen: S/NT/ND/+BS/no masses or organomegaly noted.  Extremities: no C/C/E/+pedal pulses   Data Reviewed: Basic Metabolic Panel:  Recent Labs Lab 04/24/13 2022 04/24/13 2027 04/26/13 0442  NA 136 140 136  K 3.9 3.9 3.3*  CL 95* 103 104  CO2 23  --  25  GLUCOSE 135* 137* 89  BUN 21 23 17   CREATININE 1.10 1.10 1.16  CALCIUM 10.2  --  8.4   Liver Function Tests:  Recent Labs Lab 04/24/13 2022  AST 27  ALT 19  ALKPHOS 171*  BILITOT 1.0  PROT 9.0*  ALBUMIN 4.4   No results found for this basename: LIPASE, AMYLASE,  in the last 168 hours No results found for this basename: AMMONIA,  in the last 168 hours CBC:  Recent Labs Lab 04/24/13 2022 04/24/13 2027 04/26/13 0442  WBC 17.3*  --  11.4*  NEUTROABS 12.5*  --   --   HGB 16.2 18.0* 11.5*  HCT 47.1 53.0* 35.0*  MCV 87.2  --  87.5  PLT 417*  --  259   Cardiac Enzymes:  Recent Labs Lab 04/24/13 2022 04/25/13 0450  CKTOTAL 631* 564*  TROPONINI <0.30  --    BNP (last 3 results) No results found for this basename: PROBNP,  in the last 8760 hours CBG:  Recent Labs Lab 04/25/13 1152 04/25/13 1655 04/25/13 2136 04/26/13 0730 04/26/13 1131  GLUCAP 122* 89 76  86 90    No results found for this or any previous visit (from the past 240 hour(s)).   Studies: Dg Chest 2 View  04/25/2013   *RADIOLOGY REPORT*  Clinical Data: Stroke.  Diabetes  CHEST - 2 VIEW  Comparison: 04/24/2013  Findings: Normal heart size.  No pleural effusion or edema.  No airspace consolidation noted.  Review of the visualized osseous structures is unremarkable.  IMPRESSION:  1.  No acute cardiopulmonary abnormalities.   Original Report Authenticated By: Signa Kell, M.D.   Ct Head Wo Contrast  04/24/2013   *RADIOLOGY REPORT*  Clinical Data:  New weakness, fell 1 day ago, history of stroke, diabetes, hypertension  CT HEAD WITHOUT CONTRAST  Technique:  Contiguous axial images  were obtained from the base of the skull through the vertex without contrast.  Comparison: None  Findings: Generalized atrophy. Normal ventricular morphology. No midline shift or mass effect. Small vessel chronic ischemic changes of deep cerebral white matter. Old bilateral basal ganglia lacunar infarcts. No intracranial hemorrhage, mass lesion or evidence of acute infarction. No extra-axial fluid collections. Bones and sinuses unremarkable.  IMPRESSION: Atrophy with small vessel chronic ischemic changes of deep cerebral white matter. Old bilateral basal ganglia lacunar infarcts. No acute intracranial abnormalities.   Original Report Authenticated By: Ulyses Southward, M.D.   Mr [facility] Wo Contrast  04/25/2013   *RADIOLOGY REPORT*  Clinical Data:  The patient was found lying in bed, weak with difficulty speaking.  Stroke risk factors include prior CVA, hypertension, and diabetes mellitus.  MRI HEAD WITHOUT CONTRAST MRA HEAD WITHOUT CONTRAST  Technique:  Multiplanar, multiecho pulse sequences of the brain and surrounding structures were obtained without intravenous contrast. Angiographic images of the head were obtained using MRA technique without contrast.  Comparison:  CT head 04/24/2013.  MRI HEAD  Findings:  There is an acute right MCA territory infarct affecting the lenticular nucleus, primarily putamen, posterior limb internal capsule, and periventricular white matter.  There is no associated hemorrhage. Moderate atrophy. Moderate small vessel disease.  Bilateral chronic lacunar infarcts affect the right and left basal ganglia.  No foci of chronic hemorrhage.  No mass lesion, hydrocephalus, or extra-axial fluid.  Unremarkable pituitary and cerebellar tonsils.  Diminished flow void left internal carotid artery.  No acute osseous abnormalities. Moderate cervical spondylosis.  No acute sinus or mastoid disease.  IMPRESSION: Acute right lenticulostriate territory infarct as described. Multiple foci of chronic  ischemia.  Atrophy and small vessel disease.  MRA HEAD  Findings: There is asymmetric diminished flow related enhancement in the petrous and cavernous left internal carotid artery; suspected focal inferior cavernous stenosis approaching 75%.  There is a similar 50% stenosis inferior cavernous segment right internal carotid artery.  The basilar artery is widely patent, with left vertebral dominant but right vertebral contributing.   Normal right M1 MCA.  Subtotal occlusion left A1 ACA.  Both distal anterior cerebral arteries fill from the right.   There is poor flow related enhancement into the left middle cerebral artery territory.  There is a high-grade stenosis or focal occlusion at the left distal M1 MCA segment.  No appreciable flow related enhancement into the M2 and M3 vessels.  These do however appear patent on routine MR but flow appears to be diminished to a degree that MRA is not sensitive.  No proximal PCA stenosis.  Mild irregularity distal right greater than left PCAs.  No cerebellar branch occlusion.  No intracranial berry aneurysm.  IMPRESSION: Subtotal occlusion left A1 ACA.  Suspected 75% stenosis inferior cavernous segment left internal carotid artery with diminished flow related enhancement in the petrous and supraclinoid segments.  Apparent high-grade stenosis or occlusion M1 segment left MCA, with reduced or absent flow related enhancement of the M2 and M3 vessels.  Please note that the patient's acute infarction affects the right hemisphere; it is not clear that this is an acute abnormality.   Original Report Authenticated By: Davonna Belling, M.D.   Mr Brain Wo Contrast  04/25/2013   *RADIOLOGY REPORT*  Clinical Data:  The patient was found lying in bed, weak with difficulty speaking.  Stroke risk factors include prior CVA, hypertension, and diabetes mellitus.  MRI HEAD WITHOUT CONTRAST MRA HEAD WITHOUT CONTRAST  Technique:  Multiplanar, multiecho pulse sequences of the brain and surrounding  structures were obtained without intravenous contrast. Angiographic images of the head were obtained using MRA technique without contrast.  Comparison:  CT head 04/24/2013.  MRI HEAD  Findings:  There is an acute right MCA territory infarct affecting the lenticular nucleus, primarily putamen, posterior limb internal capsule, and periventricular white matter.  There is no associated hemorrhage. Moderate atrophy. Moderate small vessel disease.  Bilateral chronic lacunar infarcts affect the right and left basal ganglia.  No foci of chronic hemorrhage.  No mass lesion, hydrocephalus, or extra-axial fluid.  Unremarkable pituitary and cerebellar tonsils.  Diminished flow void left internal carotid artery.  No acute osseous abnormalities. Moderate cervical spondylosis.  No acute sinus or mastoid disease.  IMPRESSION: Acute right lenticulostriate territory infarct as described. Multiple foci of chronic ischemia.  Atrophy and small vessel disease.  MRA HEAD  Findings: There is asymmetric diminished flow related enhancement in the petrous and cavernous left internal carotid artery; suspected focal inferior cavernous stenosis approaching 75%.  There is a similar 50% stenosis inferior cavernous segment right internal carotid artery.  The basilar artery is widely patent, with left vertebral dominant but right vertebral contributing.   Normal right M1 MCA.  Subtotal occlusion left A1 ACA.  Both distal anterior cerebral arteries fill from the right.   There is poor flow related enhancement into the left middle cerebral artery territory.  There is a high-grade stenosis or focal occlusion at the left distal M1 MCA segment.  No appreciable flow related enhancement into the M2 and M3 vessels.  These do however appear patent on routine MR but flow appears to be diminished to a degree that MRA is not sensitive.  No proximal PCA stenosis.  Mild irregularity distal right greater than left PCAs.  No cerebellar branch occlusion.  No  intracranial berry aneurysm.  IMPRESSION: Subtotal occlusion left A1 ACA.  Suspected 75% stenosis inferior cavernous segment left internal carotid artery with diminished flow related enhancement in the petrous and supraclinoid segments.  Apparent high-grade stenosis or occlusion M1 segment left MCA, with reduced or absent flow related enhancement of the M2 and M3 vessels.  Please note that the patient's acute infarction affects the right hemisphere; it is not clear that this is an acute abnormality.   Original Report Authenticated By: Davonna Belling, M.D.   Dg Chest Port 1 View  04/24/2013   *RADIOLOGY REPORT*  Clinical Data: Weakness.  PORTABLE CHEST - 1 VIEW  Comparison: None.  Findings: Semi upright view of the chest was obtained.  There is soft tissue fullness in the right hilum.  Heart size is normal.  No focal lung disease or edema.  The trachea is midline.  IMPRESSION: Soft tissue fullness in the right hilum.  Findings could be vascular in etiology but cannot exclude lymphadenopathy. Recommend further evaluation with a two-view chest exam or chest CT.  No focal lung disease.   Original Report Authenticated By: Richarda Overlie, M.D.    Scheduled Meds: . [START ON 04/27/2013] clopidogrel  75 mg Oral Q breakfast  . enoxaparin (LOVENOX) injection  40 mg Subcutaneous Q24H  . insulin aspart  0-9 Units Subcutaneous TID WC  . insulin aspart  3 Units Subcutaneous TID WC  . lisinopril  40 mg Oral Daily  . PARoxetine  20 mg Oral Daily   Continuous Infusions: . sodium chloride 75 mL/hr at 04/26/13 3244    Principal Problem:   CVA (cerebral infarction) Active Problems:   Accelerated hypertension   Leukocytosis   Depression   Type II or unspecified type diabetes mellitus with neurological manifestations, not stated as uncontrolled(250.60)    Time spent: 35 minutes.    Chaya Jan  [facility] Pager [phone]  If 7PM-7AM, please contact night-coverage at [email or web address], password  [facility] 04/26/2013, 1:02 PM  LOS: 2 days

## 2013-04-26 NOTE — Evaluation (Signed)
Occupational Therapy Evaluation Patient Details Name: Ryan Brady MRN: [id number] DOB: 04-11-1946 Today's Date: 04/26/2013 Time: 6295-2841 OT Time Calculation (min): 47 min  OT Assessment / Plan / Recommendation History of present illness Pt with acute R MCA CVA with L sided weakness (UE/grip > LLE) and some L sided inattention.  Was indpenedent PTA without use of AD.  Lives in apartment with son/daughter in law close by.  Unsure if they are able to provide 24/7 assist at this time.     Clinical Impression   Pt presents to OT with mild Lt. UE weakness and coordination deficits; Lt. Homonomous hemianopsia; impaired balance, impaired cognition, as well as communication deficits.  Feel he will benefit from continued OT to address deficits to allow him to return home with 24 hour supervision after rehab.  Recommend CIR    OT Assessment  Patient needs continued OT Services    Follow Up Recommendations  CIR;Supervision/Assistance - 24 hour    Barriers to Discharge Decreased caregiver support    Equipment Recommendations  None recommended by OT    Recommendations for Other Services Rehab consult  Frequency  Min 2X/week    Precautions / Restrictions Precautions Precautions: Fall Precaution Comments: L sided weakness (UE >LE) and some mild L sided inattention.  Restrictions Weight Bearing Restrictions: No       ADL  Eating/Feeding: Supervision/safety Where Assessed - Eating/Feeding: Chair Grooming: Wash/dry hands;Moderate assistance (for thoroughness) Where Assessed - Grooming: Unsupported standing Upper Body Bathing: Moderate assistance Where Assessed - Upper Body Bathing: Unsupported sitting Lower Body Bathing: Moderate assistance Where Assessed - Lower Body Bathing: Unsupported sit to stand Upper Body Dressing: Moderate assistance Where Assessed - Upper Body Dressing: Unsupported sitting Lower Body Dressing: Maximal assistance Where Assessed - Lower Body Dressing: Supported  sit to stand Toilet Transfer: Minimal assistance Toilet Transfer Method: Sit to stand;Stand pivot Toilet Transfer Equipment: Comfort height toilet Toileting - Clothing Manipulation and Hygiene: Moderate assistance Where Assessed - Toileting Clothing Manipulation and Hygiene: Standing Transfers/Ambulation Related to ADLs: min A ADL Comments: Pt able to doff Rt. sock and don with min A and increased time.  required cues for problem solving.  Unable to doff Lt. sock due inablity to problem solve through alternatives when he was unsuccessful and fatigue.  Pt. noted to fatigue as assist was provided to clean peri area (due to incontinence).  Pt. flexing knees and fatiguing when standing.   Pt requires assist with bathing due to decreased thoroughness and problem solving deficits    OT Diagnosis: Generalized weakness;Cognitive deficits;Disturbance of vision  OT Problem List: Decreased strength;Decreased activity tolerance;Impaired balance (sitting and/or standing);Impaired vision/perception;Decreased coordination;Decreased cognition;Decreased safety awareness;Decreased knowledge of use of DME or AE;Impaired UE functional use OT Treatment Interventions: Self-care/ADL training;DME and/or AE instruction;Therapeutic activities;Cognitive remediation/compensation;Visual/perceptual remediation/compensation;Patient/family education;Balance training   OT Goals(Current goals can be found in the care plan section) Acute Rehab OT Goals Patient Stated Goal: Pt did not state OT Goal Formulation: With patient/family Time For Goal Achievement: 05/03/13 Potential to Achieve Goals: Good ADL Goals Pt Will Perform Eating: with modified independence;sitting Pt Will Perform Grooming: with supervision;standing Pt Will Perform Upper Body Bathing: with supervision;sitting Pt Will Perform Lower Body Bathing: with min assist;sit to/from stand Pt Will Perform Upper Body Dressing: with min assist;sitting Pt Will Perform Lower  Body Dressing: with min assist;sit to/from stand Pt Will Transfer to Toilet: with supervision;regular height toilet;ambulating Pt Will Perform Toileting - Clothing Manipulation and hygiene: with supervision;sit to/from stand Additional ADL Goal #1: Pt will  locate items on Lt. with no more than min cues during BADL activities  Visit Information  Last OT Received On: 04/26/13 Assistance Needed: +1 Reason Eval/Treat Not Completed: Medical issues which prohibited therapy History of Present Illness: Pt with acute R MCA CVA with L sided weakness (UE/grip > LLE) and some L sided inattention.  Was indpenedent PTA without use of AD.  Lives in apartment with son/daughter in law close by.  Unsure if they are able to provide 24/7 assist at this time.         Prior Functioning     Home Living Family/patient expects to be discharged to:: Private residence Living Arrangements: Alone Available Help at Discharge: Family;Available PRN/intermittently Type of Home: Apartment Home Access: Level entry Home Layout: One level Home Equipment: None Prior Function Level of Independence: Independent Comments: Pt does not drive due to glaucoma.  But was independent with all IADLs Communication Communication: Expressive difficulties (soft spoken, some garbled language, mostly understandable. ) Dominant Hand: Right         Vision/Perception Vision - History Baseline Vision: Other (comment) (to watch TV.  Pt does not read) Visual History: Glaucoma Patient Visual Report: No change from baseline Vision - Assessment Eye Alignment: Within Functional Limits Vision Assessment: Vision tested Ocular Range of Motion: Within Functional Limits Tracking/Visual Pursuits: Other (comment) (loses object in Lt. visual field) Visual Fields: Left homonymous hemianopsia Additional Comments: Pt insists that Lt. HH is old and due to glaucoma; however, son reports this is a new deficit Perception Perception: Within  Functional Limits Praxis Praxis: Intact   Cognition  Cognition Arousal/Alertness: Awake/alert Behavior During Therapy: Flat affect (son reports this is pt baseline) Overall Cognitive Status: Impaired/Different from baseline Area of Impairment: Orientation;Attention;Awareness;Problem solving Orientation Level: Disoriented to;Place Current Attention Level: Sustained Awareness: Intellectual Problem Solving: Slow processing;Requires verbal cues;Requires tactile cues General Comments: Pt incontinent of urine without awareness.  Pt denies that visual deficits are new, but son reports that they are.  Pt insistent that lt. HH is due to glaucoma and his normal     Extremity/Trunk Assessment Upper Extremity Assessment Upper Extremity Assessment: LUE deficits/detail LUE Deficits / Details: Brunnstrom stage V.  Ulnar drift noted LUE Sensation:  (Pt denies sensory changes) LUE Coordination: decreased fine motor Lower Extremity Assessment Lower Extremity Assessment: Defer to PT evaluation LLE Deficits / Details: Pt with 5/5 strength in knee flex/ext and ankle PF/DF, note 4/5 strength at hip flex LLE Sensation: decreased light touch (somewhat decreased sensation at L3/4 level dermatomes) Cervical / Trunk Assessment Cervical / Trunk Assessment: Normal     Mobility Bed Mobility Bed Mobility: Not assessed Supine to Sit: 5: Supervision;HOB elevated;With rails Details for Bed Mobility Assistance: Requires increased time and use of rails to complete task.  Provided min cues for scooting to EOB once in sitting position.  Transfers Transfers: Sit to Stand;Stand to Sit Sit to Stand: 4: Min assist;From chair/3-in-1;From toilet;With upper extremity assist Stand to Sit: 4: Min assist;With upper extremity assist;To chair/3-in-1;To toilet Details for Transfer Assistance: Assist and cues to turn fully to sit and for balance      Exercise     Balance     End of Session OT - End of Session Activity  Tolerance: Patient tolerated treatment well Patient left: in chair;with call bell/phone within reach;with family/visitor present  GO     Tristin Gladman, Ursula Alert M 04/26/2013, 1:03 PM

## 2013-04-26 NOTE — Progress Notes (Signed)
  Echocardiogram 2D Echocardiogram has been performed.  Cathie Beams 04/26/2013, 8:42 AM

## 2013-04-26 NOTE — Progress Notes (Signed)
OT Cancellation Note  Patient Details Name: Ryan Brady MRN: [id number] DOB: Dec 13, 1945   Cancelled Treatment:    Reason Eval/Treat Not Completed: Medical issues which prohibited therapy - pt currently with orders for Bedrest.  MD please advance activity orders when appropriate.  Thanks!  Jeani Hawking M [phone] 04/26/2013, 10:04 AM

## 2013-04-27 DIAGNOSIS — I6330 Cerebral infarction due to thrombosis of unspecified cerebral artery: Secondary | ICD-10-CM

## 2013-04-27 LAB — BASIC METABOLIC PANEL
CO2: 23 mEq/L (ref 19–32)
Chloride: 103 mEq/L (ref 96–112)
Sodium: 136 mEq/L (ref 135–145)

## 2013-04-27 LAB — GLUCOSE, CAPILLARY
Glucose-Capillary: 78 mg/dL (ref 70–99)
Glucose-Capillary: 113 mg/dL — ABNORMAL HIGH (ref 70–99)

## 2013-04-27 NOTE — Progress Notes (Signed)
Inpatient Diabetes Program Recommendations  AACE/ADA: New Consensus Statement on Inpatient Glycemic Control (2013)  Target Ranges:  Prepandial:   less than 140 mg/dL      Peak postprandial:   less than 180 mg/dL (1-2 hours)      Critically ill patients:  140 - 180 mg/dL   Reason for Assessment: Hypoglycemia on 04/26/2013  Inpatient Diabetes Program Recommendations Correction (SSI): Currently ordered Novolog Sensitive correction tid Insulin - Meal Coverage: Currently ordered Novolog 3 units tid with meals HgbA1C: 5.6  Diet: Dysphagia 2  Note:  Results for Ryan Brady, Ryan Brady (MRN [id number]) as of 04/27/2013 11:50  Ref. Range 04/26/2013 07:30 04/26/2013 11:31 04/26/2013 16:43 04/26/2013 21:44 04/27/2013 00:47 04/27/2013 07:47  Glucose-Capillary Latest Range: 70-99 mg/dL 86 90 96 69 (L) 409 (H) 78   Request MD stop Novolog 3 units meal coverage tid with meals.  Apparently patient on no DM meds prior to admission per Medication Reconciliation information.  Hgb A1C indicates tight glycemic control prior to admission with no DM medication in this patient who had a CVA.  Thank you.  Serah Nicoletti S. Elsie Lincoln, RN, CNS, CDE Inpatient Diabetes Program, team pager [phone]

## 2013-04-27 NOTE — Consult Note (Signed)
Physical Medicine and Rehabilitation Consult Reason for Consult: CVA Referring Physician: [facility]   HPI: Ryan Brady is a 67 y.o. right-handed male with history of hypertension, diabetes mellitus and CVA. Admitted 04/24/2013 with difficulty speaking. Patient independent prior to admission without the use of assistive device. MRI of the brain shows acute right MCA territory infarct as well as bilateral chronic lacunar infarcts affecting the right and left basal ganglia. MRA of the head was subtotal occlusion left A1 ACA. Echocardiogram with ejection fraction of 70% grade 1 diastolic dysfunction without emboli. Carotid Dopplers with 0-39% right ICA stenosis. Left ICA flow not visualized past the very proximal portion, question occlusion. Patient did not receive TPA. Placed on Plavix therapy for CVA prophylaxis as well as subcutaneous Lovenox for DVT prophylaxis. Patient had been on aspirin prior to admission. Currently maintained on a dysphagia 2 thin liquid diet. Physical and occupational therapy evaluations completed with recommendations of physical medicine rehabilitation consult to consider inpatient rehabilitation services  Patient states that he lives alone. Previously independent. Review of Systems  Unable to perform ROS  Past Medical History  Diagnosis Date  . Stroke   . Diabetes mellitus without complication   . Hypertension    Past Surgical History  Procedure Laterality Date  . Tonsillectomy     History reviewed. No pertinent family history. Social History:  reports that he has been smoking.  He has never used smokeless tobacco. He reports that he does not drink alcohol or use illicit drugs. Allergies: No Known Allergies Medications Prior to Admission  Medication Sig Dispense Refill  . aspirin EC 81 MG tablet Take 81 mg by mouth daily.      Marland Kitchen lisinopril (PRINIVIL,ZESTRIL) 40 MG tablet Take 1 tablet (40 mg total) by mouth daily.  30 tablet  1  . PARoxetine (PAXIL) 20 MG tablet Take  1 tablet (20 mg total) by mouth every morning.  30 tablet  0    Home: Home Living Family/patient expects to be discharged to:: Private residence Living Arrangements: Alone Available Help at Discharge: Family;Available PRN/intermittently Type of Home: Apartment Home Access: Level entry Home Layout: One level Home Equipment: None  Functional History: Prior Function Comments: Pt does not drive due to glaucoma.  But was independent with all IADLs Functional Status:  Mobility: Bed Mobility Bed Mobility: Not assessed Supine to Sit: 5: Supervision;HOB elevated;With rails Transfers Transfers: Sit to Stand;Stand to Sit Sit to Stand: 4: Min assist;From chair/3-in-1;From toilet;With upper extremity assist Stand to Sit: 4: Min assist;With upper extremity assist;To chair/3-in-1;To toilet Ambulation/Gait Ambulation/Gait Assistance: 3: Mod assist Ambulation Distance (Feet): 150 Feet (then another 100') Assistive device: Rolling walker;None Ambulation/Gait Assistance Details: Ambulated initially with  RW for safety and noted that pt tends to list to the left with ambulation.  He was able to self correct, however did require increased time.  He was also able to state that he felt himself going left.  Also note that he tends to keep his head and eyes focused on the right with ambulation and when RW taken away to reassess gait, he would almost turn sideways towards R while ambulating.  Did not notice any foot drop or steppage gait, however did note decreased swing phase on R side.     Gait Pattern: Step-through pattern;Decreased step length - right;Ataxic Gait velocity: decreased General Gait Details: mild ataxia    ADL: ADL Eating/Feeding: Supervision/safety Where Assessed - Eating/Feeding: Chair Grooming: Wash/dry hands;Moderate assistance (for thoroughness) Where Assessed - Grooming: Unsupported standing Upper Body Bathing:  Moderate assistance Where Assessed - Upper Body Bathing: Unsupported  sitting Lower Body Bathing: Moderate assistance Where Assessed - Lower Body Bathing: Unsupported sit to stand Upper Body Dressing: Moderate assistance Where Assessed - Upper Body Dressing: Unsupported sitting Lower Body Dressing: Maximal assistance Where Assessed - Lower Body Dressing: Supported sit to stand Toilet Transfer: Minimal assistance Toilet Transfer Method: Sit to stand;Stand pivot Toilet Transfer Equipment: Comfort height toilet Transfers/Ambulation Related to ADLs: min A ADL Comments: Pt able to doff Rt. sock and don with min A and increased time.  required cues for problem solving.  Unable to doff Lt. sock due inablity to problem solve through alternatives when he was unsuccessful and fatigue.  Pt. noted to fatigue as assist was provided to clean peri area (due to incontinence).  Pt. flexing knees and fatiguing when standing.   Pt requires assist with bathing due to decreased thoroughness and problem solving deficits  Cognition: Cognition Overall Cognitive Status: Impaired/Different from baseline Orientation Level: Oriented to person;Oriented to place Cognition Arousal/Alertness: Awake/alert Behavior During Therapy: Flat affect (son reports this is pt baseline) Overall Cognitive Status: Impaired/Different from baseline Area of Impairment: Orientation;Attention;Awareness;Problem solving Orientation Level: Disoriented to;Place Current Attention Level: Sustained Awareness: Intellectual Problem Solving: Slow processing;Requires verbal cues;Requires tactile cues General Comments: Pt incontinent of urine without awareness.  Pt denies that visual deficits are new, but son reports that they are.  Pt insistent that lt. HH is due to glaucoma and his normal   Blood pressure 170/76, pulse 101, temperature 98.7 F (37.1 C), temperature source Oral, resp. rate 20, height 5\' 9"  (1.753 m), weight 76.2 kg (167 lb 15.9 oz), SpO2 96.00%. Physical Exam  Vitals reviewed. HENT:  Head:  Normocephalic.  Eyes: EOM are normal.  Neck: Normal range of motion. Neck supple. No thyromegaly present.  Cardiovascular: Normal rate and regular rhythm.   Pulmonary/Chest: Effort normal and breath sounds normal. No respiratory distress.  Abdominal: Soft. Bowel sounds are normal. He exhibits no distension.  Neurological: He is alert.  Patient is aphasic and does follow simple commands.  Skin: Skin is warm and dry.   visual fields are intact the confrontational testing, left neglect noted with double simultaneous stimulation Neuro:  Eyes without evidence of nystagmus  Tone is normal without evidence of spasticity Cerebellar exam shows no evidence of ataxia on finger nose finger or heel to shin testing No evidence of trunkal ataxia  Motor strength is 5/5 in bilateral deltoid, biceps, triceps, finger flexors and extensors, wrist flexors and extensors, hip flexors, knee flexors and extensors, ankle dorsiflexors, plantar flexors, invertors and evertors, toe flexors and extensors  Sensory exam is normal to pinprick, proprioception and light touch in the upper and lower limbs   Cranial nerves II- Visual fields are intact to confrontation testing, no blurring of vision III- no evidence of ptosis, upward, downward and medial gaze intact IV- no vertical diplopia or head tilt V- no facial numbness or masseter weakness VI- no pupil abduction weakness VII- no facial droop, good lid closure VII- normal auditory acuity IX- no pharygeal weakness, gag nl X- no pharyngeal weakness, no hoarseness XI- no trap or SCM weakness XII- no glossal weakness    Results for orders placed during the hospital encounter of 04/24/13 (from the past 24 hour(s))  GLUCOSE, CAPILLARY     Status: None   Collection Time    04/26/13 11:31 AM      Result Value Range   Glucose-Capillary 90  70 - 99 mg/dL  GLUCOSE, CAPILLARY  Status: None   Collection Time    04/26/13  4:43 PM      Result Value Range    Glucose-Capillary 96  70 - 99 mg/dL  GLUCOSE, CAPILLARY     Status: Abnormal   Collection Time    04/26/13  9:44 PM      Result Value Range   Glucose-Capillary 69 (*) 70 - 99 mg/dL  GLUCOSE, CAPILLARY     Status: Abnormal   Collection Time    04/27/13 12:47 AM      Result Value Range   Glucose-Capillary 115 (*) 70 - 99 mg/dL  BASIC METABOLIC PANEL     Status: Abnormal   Collection Time    04/27/13  4:42 AM      Result Value Range   Sodium 136  135 - 145 mEq/L   Potassium 3.6  3.5 - 5.1 mEq/L   Chloride 103  96 - 112 mEq/L   CO2 23  19 - 32 mEq/L   Glucose, Bld 87  70 - 99 mg/dL   BUN 12  6 - 23 mg/dL   Creatinine, Ser 1.61  0.50 - 1.35 mg/dL   Calcium 8.8  8.4 - 09.6 mg/dL   GFR calc non Af Amer 70 (*) >90 mL/min   GFR calc Af Amer 81 (*) >90 mL/min   Dg Chest 2 View  04/25/2013   *RADIOLOGY REPORT*  Clinical Data: Stroke.  Diabetes  CHEST - 2 VIEW  Comparison: 04/24/2013  Findings: Normal heart size.  No pleural effusion or edema.  No airspace consolidation noted.  Review of the visualized osseous structures is unremarkable.  IMPRESSION:  1.  No acute cardiopulmonary abnormalities.   Original Report Authenticated By: Signa Kell, M.D.   Mr [facility] Wo Contrast  04/25/2013   *RADIOLOGY REPORT*  Clinical Data:  The patient was found lying in bed, weak with difficulty speaking.  Stroke risk factors include prior CVA, hypertension, and diabetes mellitus.  MRI HEAD WITHOUT CONTRAST MRA HEAD WITHOUT CONTRAST  Technique:  Multiplanar, multiecho pulse sequences of the brain and surrounding structures were obtained without intravenous contrast. Angiographic images of the head were obtained using MRA technique without contrast.  Comparison:  CT head 04/24/2013.  MRI HEAD  Findings:  There is an acute right MCA territory infarct affecting the lenticular nucleus, primarily putamen, posterior limb internal capsule, and periventricular white matter.  There is no associated hemorrhage. Moderate  atrophy. Moderate small vessel disease.  Bilateral chronic lacunar infarcts affect the right and left basal ganglia.  No foci of chronic hemorrhage.  No mass lesion, hydrocephalus, or extra-axial fluid.  Unremarkable pituitary and cerebellar tonsils.  Diminished flow void left internal carotid artery.  No acute osseous abnormalities. Moderate cervical spondylosis.  No acute sinus or mastoid disease.  IMPRESSION: Acute right lenticulostriate territory infarct as described. Multiple foci of chronic ischemia.  Atrophy and small vessel disease.  MRA HEAD  Findings: There is asymmetric diminished flow related enhancement in the petrous and cavernous left internal carotid artery; suspected focal inferior cavernous stenosis approaching 75%.  There is a similar 50% stenosis inferior cavernous segment right internal carotid artery.  The basilar artery is widely patent, with left vertebral dominant but right vertebral contributing.   Normal right M1 MCA.  Subtotal occlusion left A1 ACA.  Both distal anterior cerebral arteries fill from the right.   There is poor flow related enhancement into the left middle cerebral artery territory.  There is a high-grade stenosis or focal occlusion  at the left distal M1 MCA segment.  No appreciable flow related enhancement into the M2 and M3 vessels.  These do however appear patent on routine MR but flow appears to be diminished to a degree that MRA is not sensitive.  No proximal PCA stenosis.  Mild irregularity distal right greater than left PCAs.  No cerebellar branch occlusion.  No intracranial berry aneurysm.  IMPRESSION: Subtotal occlusion left A1 ACA.  Suspected 75% stenosis inferior cavernous segment left internal carotid artery with diminished flow related enhancement in the petrous and supraclinoid segments.  Apparent high-grade stenosis or occlusion M1 segment left MCA, with reduced or absent flow related enhancement of the M2 and M3 vessels.  Please note that the patient's acute  infarction affects the right hemisphere; it is not clear that this is an acute abnormality.   Original Report Authenticated By: Davonna Belling, M.D.   Mr Brain Wo Contrast  04/25/2013   *RADIOLOGY REPORT*  Clinical Data:  The patient was found lying in bed, weak with difficulty speaking.  Stroke risk factors include prior CVA, hypertension, and diabetes mellitus.  MRI HEAD WITHOUT CONTRAST MRA HEAD WITHOUT CONTRAST  Technique:  Multiplanar, multiecho pulse sequences of the brain and surrounding structures were obtained without intravenous contrast. Angiographic images of the head were obtained using MRA technique without contrast.  Comparison:  CT head 04/24/2013.  MRI HEAD  Findings:  There is an acute right MCA territory infarct affecting the lenticular nucleus, primarily putamen, posterior limb internal capsule, and periventricular white matter.  There is no associated hemorrhage. Moderate atrophy. Moderate small vessel disease.  Bilateral chronic lacunar infarcts affect the right and left basal ganglia.  No foci of chronic hemorrhage.  No mass lesion, hydrocephalus, or extra-axial fluid.  Unremarkable pituitary and cerebellar tonsils.  Diminished flow void left internal carotid artery.  No acute osseous abnormalities. Moderate cervical spondylosis.  No acute sinus or mastoid disease.  IMPRESSION: Acute right lenticulostriate territory infarct as described. Multiple foci of chronic ischemia.  Atrophy and small vessel disease.  MRA HEAD  Findings: There is asymmetric diminished flow related enhancement in the petrous and cavernous left internal carotid artery; suspected focal inferior cavernous stenosis approaching 75%.  There is a similar 50% stenosis inferior cavernous segment right internal carotid artery.  The basilar artery is widely patent, with left vertebral dominant but right vertebral contributing.   Normal right M1 MCA.  Subtotal occlusion left A1 ACA.  Both distal anterior cerebral arteries fill from  the right.   There is poor flow related enhancement into the left middle cerebral artery territory.  There is a high-grade stenosis or focal occlusion at the left distal M1 MCA segment.  No appreciable flow related enhancement into the M2 and M3 vessels.  These do however appear patent on routine MR but flow appears to be diminished to a degree that MRA is not sensitive.  No proximal PCA stenosis.  Mild irregularity distal right greater than left PCAs.  No cerebellar branch occlusion.  No intracranial berry aneurysm.  IMPRESSION: Subtotal occlusion left A1 ACA.  Suspected 75% stenosis inferior cavernous segment left internal carotid artery with diminished flow related enhancement in the petrous and supraclinoid segments.  Apparent high-grade stenosis or occlusion M1 segment left MCA, with reduced or absent flow related enhancement of the M2 and M3 vessels.  Please note that the patient's acute infarction affects the right hemisphere; it is not clear that this is an acute abnormality.   Original Report Authenticated By: Davonna Belling,  M.D.    Assessment/Plan: Diagnosis: right MCA infarct with left neglect  1. Does the need for close, 24 hr/day medical supervision in concert with the patient's rehab needs make it unreasonable for this patient to be served in a less intensive setting? Yes 2. Co-Morbidities requiring supervision/potential complications:  diabetes, hypertension  3. Due to bladder management, bowel management, safety, skin/wound care, disease management, medication administration, pain management and patient education, does the patient require 24 hr/day rehab nursing? Yes 4. Does the patient require coordinated care of a physician, rehab nurse, PT (1-2 hrs/day, 5 days/week), OT (1-2 hrs/day, 5 days/week) and SLP (0.5-1 hrs/day, 5 days/week) to address physical and functional deficits in the context of the above medical diagnosis(es)? Yes Addressing deficits in the following areas: balance,  endurance, locomotion, transferring, bowel/bladder control, bathing, dressing, feeding, grooming, toileting and cognition 5. Can the patient actively participate in an intensive therapy program of at least 3 hrs of therapy per day at least 5 days per week? Yes 6. The potential for patient to make measurable gains while on inpatient rehab is goodAnticipated su  functional outcomes upon discharge from inpatient rehab are supervision with PTsupervision,with OT, independent medication management  with SLP. 7. Estimated rehab length of stay to reach the above functional goals is: 2 weeks 8. Does the patient have adequate social supports to accommodate these discharge functional goals? Potentially 9. Anticipated D/C setting: Home 10. Anticipated post D/C treatments: HH therapy 11. Overall Rehab/Functional Prognosis: good  RECOMMENDATIONS: This patient's condition is appropriate for continued rehabilitative care in the following setting: CIR Patient has agreed to participate in recommended program. Potentially Note that insurance prior authorization may be required for reimbursement for recommended care.  Comment: not sure if pt has supervision    04/27/2013

## 2013-04-27 NOTE — Progress Notes (Signed)
Clinical Social Work Department CLINICAL SOCIAL WORK PLACEMENT NOTE 04/27/2013  Patient:  Ryan Brady, Ryan Brady  Account Number:  [id number] Admit date:  04/24/2013  Clinical Social Worker:  Unk Lightning, LCSW  Date/time:  04/27/2013 01:45 PM  Clinical Social Work is seeking post-discharge placement for this patient at the following level of care:   SKILLED NURSING   (*CSW will update this form in Epic as items are completed)   04/27/2013  Patient/family provided with [facility] Department of Clinical Social Work's list of facilities offering this level of care within the geographic area requested by the patient (or if unable, by the patient's family).  04/27/2013  Patient/family informed of their freedom to choose among providers that offer the needed level of care, that participate in Medicare, Medicaid or managed care program needed by the patient, have an available bed and are willing to accept the patient.  04/27/2013  Patient/family informed of [facility]' ownership interest in [facility], as well as of the fact that they are under no obligation to receive care at this facility.  PASARR submitted to [employer] on 04/27/2013 PASARR number received from [employer] on 04/27/2013  FL2 transmitted to all facilities in geographic area requested by pt/family on  04/27/2013 FL2 transmitted to all facilities within larger geographic area on   Patient informed that his/her managed care company has contracts with or will negotiate with  certain facilities, including the following:     Patient/family informed of bed offers received:   Patient chooses bed at  Physician recommends and patient chooses bed at    Patient to be transferred to  on   Patient to be transferred to facility by   The following physician request were entered in Epic:   Additional Comments:

## 2013-04-27 NOTE — Evaluation (Signed)
Speech Language Pathology Evaluation Patient Details Name: Ryan Brady MRN: [id number] DOB: [birth year] Today's Date: 04/27/2013 Time: 4098-1191 SLP Time Calculation (min): 17 min  Problem List:  Patient Active Problem List   Diagnosis Date Noted  . Hypokalemia 04/26/2013  . Type II or unspecified type diabetes mellitus with neurological manifestations, not stated as uncontrolled(250.60) 04/25/2013  . CVA (cerebral infarction) 04/24/2013  . Accelerated hypertension 04/24/2013  . Leukocytosis 04/24/2013  . Depression 04/24/2013   Past Medical History:  Past Medical History  Diagnosis Date  . Stroke   . Diabetes mellitus without complication   . Hypertension    Past Surgical History:  Past Surgical History  Procedure Laterality Date  . Tonsillectomy     HPI:  Pt is a 67 year old male with h/o CVA and HTN, and was admitted to [facility] ED after family found him lying in bed, weak, and with difficulty expressing himself. CT head revealed atrophy, small vessel chronic ischemic WM changes, old bilateral basal ganglia lacunar infarcts, but no acute intracranial abnormalities. Pt has been seen by PT.OT who recommend CIR and 24/7 supervision.  SLP order for speech cognitive evaluation received.    Assessment / Plan / Recommendation Clinical Impression  Pt presents with moderate cognitive linguistic deficits characterized by decreased problem solving skills,  poor awareness to deficits, memory deficits and decreased attention.  Pt denied need to use call bell for assist and states he can go home and be independent currently demonstrating poor awareness.  He also denies left inattention that was documented by OT.  He was unable to state his current address but was oriented to situation (stroke).    Moderate dysarthria and mild word finding deficits impact his communication.  Pt has a flat affect but did become tearful toward end of evaluation, ? lability vs sadness from his current situation.  Pt  educated to findings and recommendations/goals.  Recommend pt have follow up SLP for cognitive linguistic skill development to return as close to baseline as able.      SLP Assessment    Moderate cognitive linguistic deficits, moderate dysarthria    Follow Up Recommendations  Inpatient Rehab;Home health SLP (24/7 supervision)    Frequency and Duration min 2x/week      Pertinent Vitals/Pain Afebrile, decreased   SLP Goals  SLP Goals Potential to Achieve Goals: Fair Potential Considerations: Cooperation/participation level;Ability to learn/carryover information;Previous level of function (unknown prior level of function) Progress/Goals/Alternative treatment plan discussed with pt/caregiver and they: Agree SLP Goal #1: pt will demonstrate intellectual awareness to cognitive and physical deficits with moderate verbal cues.  SLP Goal #2: Pt will demonstrate basic problem solving skills in functional tasks with moderate verbal cues.    SLP Evaluation Prior Functioning  Type of Home: Apartment Available Help at Discharge: Family;Available PRN/intermittently   Cognition  Overall Cognitive Status: No family/caregiver present to determine baseline cognitive functioning (suspect different from baseline) Orientation Level: Oriented to person;Oriented to place;Oriented to situation Attention: Sustained Sustained Attention: Impaired Sustained Attention Impairment: Functional basic Memory: Impaired (pt did not recall his home address) Memory Impairment: Storage deficit;Retrieval deficit;Decreased recall of new information;Decreased short term memory;Prospective memory Decreased Short Term Memory: Verbal basic;Functional basic Awareness: Impaired Awareness Impairment: Intellectual impairment;Emergent impairment Problem Solving: Impaired Problem Solving Impairment: Verbal basic;Functional basic Behaviors: Restless;Impulsive;Perseveration;Lability Safety/Judgment: Impaired (pt requests to  return home, denies need for rehab)    Comprehension  Auditory Comprehension Yes/No Questions: Not tested Commands: Impaired Two Step Basic Commands: 75-100% accurate (with delay) Conversation: Complex Interfering  Components: Processing speed;Working Radio broadcast assistant: Dietitian: Not tested Reading Comprehension Reading Status: Not tested (pt stated he does not read, family takes care of finances)    Expression Expression Primary Mode of Expression: Verbal Verbal Expression Overall Verbal Expression: Impaired Initiation: Impaired (mild initial sound repetition) Level of Generative/Spontaneous Verbalization: Phrase;Sentence Repetition: No impairment Naming: Impairment Responsive: Not tested Confrontation: Impaired Convergent: 75-100% accurate Verbal Errors: Perseveration Pragmatics: Impairment Impairments: Abnormal affect;Monotone Effective Techniques: Phonemic cues Non-Verbal Means of Communication: Not applicable Written Expression Dominant Hand: Right Written Expression: Not tested   Oral / Motor Oral Motor/Sensory Function Overall Oral Motor/Sensory Function: Impaired Labial ROM: Within Functional Limits Labial Symmetry: Within Functional Limits Labial Strength: Reduced Labial Sensation: Reduced Lingual ROM: Reduced right;Reduced left;Other (Comment) (reduced left-right and up-down movement) Lingual Symmetry: Within Functional Limits Lingual Strength: Reduced Lingual Sensation: Reduced Facial ROM: Within Functional Limits Facial Symmetry: Within Functional Limits Facial Strength: Reduced Facial Sensation: Within Functional Limits Velum: Within Functional Limits Mandible: Within Functional Limits Motor Speech Overall Motor Speech: Impaired Respiration: Impaired Level of Impairment: Phrase Phonation: Low vocal intensity Resonance: Within functional limits Articulation:  Impaired Level of Impairment: Phrase Intelligibility: Intelligibility reduced Word: 50-74% accurate Phrase: 50-74% accurate Motor Planning: Witnin functional limits (initial sound repetition noted x1) Motor Speech Errors: Not applicable Effective Techniques: Slow rate;Over-articulate   GO     Donavan Burnet, MS [facility] SLP [phone]

## 2013-04-27 NOTE — Progress Notes (Signed)
Physical Therapy Treatment Patient Details Name: Ryan Brady MRN: [id number] DOB: 05-Mar-1946 Today's Date: 04/27/2013 Time: 7829-5621 PT Time Calculation (min): 27 min  PT Assessment / Plan / Recommendation  PT Comments   Pt improving , but still needs continued therapy for balance assist, gait and strength improvement.  Follow Up Recommendations  CIR     Does the patient have the potential to tolerate intense rehabilitation   yes  Barriers to Discharge        Equipment Recommendations   (TBD)    Recommendations for Other Services OT consult  Frequency Min 4X/week   Progress towards PT Goals Progress towards PT goals: Progressing toward goals  Plan Current plan remains appropriate    Precautions / Restrictions Restrictions Weight Bearing Restrictions: No   Pertinent Vitals/Pain No c/o pain    Mobility  Bed Mobility Bed Mobility: Supine to Sit Supine to Sit: 4: Min assist Details for Bed Mobility Assistance: Pt needed cues to get move feet to edge of bed and sit up squarely. Transfers Transfers: Sit to Stand;Stand to Sit Sit to Stand: 4: Min assist;From chair/3-in-1;From toilet;With upper extremity assist Stand to Sit: 4: Min assist;With upper extremity assist;To chair/3-in-1;To toilet Details for Transfer Assistance: Assist and cues to turn fully to sit and for balance  Ambulation/Gait Ambulation/Gait Assistance: 4: Min assist Ambulation Distance (Feet): 150 Feet Assistive device: Rolling walker Ambulation/Gait Assistance Details: occasionally needs assist with directing RW Gait Pattern: Step-through pattern;Decreased step length - right;Ataxic Gait velocity: decreased General Gait Details: mild ataxia Stairs: No Wheelchair Mobility Wheelchair Mobility: No    Exercises     PT Diagnosis:    PT Problem List:   PT Treatment Interventions:     PT Goals (current goals can now be found in the care plan section)    Visit Information  Last PT Received On:  04/27/13 Assistance Needed: +1    Subjective Data      Cognition  Cognition Arousal/Alertness: Awake/alert Overall Cognitive Status: No family/caregiver present to determine baseline cognitive functioning (suspect different from baseline) General Comments: pt cooperative    Balance  Balance Balance Assessed: Yes Static Sitting Balance Static Sitting - Balance Support: No upper extremity supported Static Sitting - Level of Assistance: 6: Modified independent (Device/Increase time) Static Standing Balance Static Standing - Balance Support: Bilateral upper extremity supported Static Standing - Level of Assistance: 6: Modified independent (Device/Increase time)  End of Session PT - End of Session Equipment Utilized During Treatment: Gait belt Activity Tolerance: Patient tolerated treatment well Patient left: in chair;with call bell/phone within reach;with family/visitor present Nurse Communication: Mobility status   GP    Rosey Bath K. Manson Passey, Pt [phone] 04/27/2013, 3:52 PM

## 2013-04-27 NOTE — Progress Notes (Signed)
Assumed care of patient. Condition stable. Continue with current plan of care

## 2013-04-27 NOTE — Progress Notes (Signed)
[facility] PROGRESS NOTE  Ryan Brady ZOX:[id number] DOB: 10/09/1946 DOA: 04/24/2013 PCP: No primary provider on file.  Assessment/Plan: CVA -MRI confirms an acute right MCA CVA. -Has had prior CVAs. -MRA with high-grade stenosis of M1 segment of the left MCA. This has been discussed via phone with neurology, Dr. Loretha Brasil, and has been decided that no further intervention required (opposite side of current CVA). -Carotid Dopplers: There is 0-39% right ICA stenosis. Left ICA flow not visualized past the very proximal portion, question occlusion. Technically difficult study secondary to constant hiccups and inability to keep head turned. Vertebral artery flow is antegrade. -2D ECHO: Study Conclusions Left ventricle: The cavity size was normal. Wall thickness was increased in a pattern of mild LVH. Systolic function was vigorous. The estimated ejection fraction was in the range of 65% to 70%. Wall motion was normal; there were no regional wall motion abnormalities. Doppler parameters are consistent with abnormal left ventricular relaxation (grade 1 diastolic dysfunction). -PT/OT/ evals rec CIR. Consult has been placed. -ASA has been upgraded to plavix for secondary stroke prevention. -LDL 92, no statin required.  HTN -Allow permissive HTN given recent CVA. -BP well controlled currently.  DM -?new diagnosis. Not on meds prior to admission. -A1c is 5.6. -Has had some hypoglycemic events. -Will DC SSI. -Well controlled.  Hypokalemia -Repleted. -Mg ok at 2.2.  DVT Prophylaxis -Lovenox.   Code Status: Full code Family Communication: Discussed with son Britt Bottom via telephone. Disposition Plan: CIR vs SNF   Consultants:  None   Antibiotics:  None   Subjective: No new events/complaints.  Objective: Filed Vitals:   04/26/13 1411 04/26/13 2148 04/27/13 0440 04/27/13 0500  BP: 152/68 173/76 171/103 170/76  Pulse: 73 64 101   Temp: 98.6 F (37 C) 98.8 F (37.1  C) 98.7 F (37.1 C)   TempSrc: Oral Oral Oral   Resp: 20 20 20    Height:      Weight:      SpO2: 99% 100% 96%     Intake/Output Summary (Last 24 hours) at 04/27/13 1348 Last data filed at 04/27/13 1330  Gross per 24 hour  Intake   3165 ml  Output   2125 ml  Net   1040 ml   Filed Weights   04/25/13 0005  Weight: 76.2 kg (167 lb 15.9 oz)    Exam:   General:  Awake  Cardiovascular: RRR, no M/R/G  Respiratory: CTA B  Abdomen: S/NT/ND/+BS/no masses or organomegaly noted.  Extremities: no C/C/E/+pedal pulses   Data Reviewed: Basic Metabolic Panel:  Recent Labs Lab 04/24/13 2022 04/24/13 2027 04/26/13 0442 04/27/13 0442  NA 136 140 136 136  K 3.9 3.9 3.3* 3.6  CL 95* 103 104 103  CO2 23  --  25 23  GLUCOSE 135* 137* 89 87  BUN 21 23 17 12   CREATININE 1.10 1.10 1.16 1.06  CALCIUM 10.2  --  8.4 8.8  MG  --   --  2.2  --    Liver Function Tests:  Recent Labs Lab 04/24/13 2022  AST 27  ALT 19  ALKPHOS 171*  BILITOT 1.0  PROT 9.0*  ALBUMIN 4.4   No results found for this basename: LIPASE, AMYLASE,  in the last 168 hours No results found for this basename: AMMONIA,  in the last 168 hours CBC:  Recent Labs Lab 04/24/13 2022 04/24/13 2027 04/26/13 0442  WBC 17.3*  --  11.4*  NEUTROABS 12.5*  --   --   HGB 16.2 18.0*  11.5*  HCT 47.1 53.0* 35.0*  MCV 87.2  --  87.5  PLT 417*  --  259   Cardiac Enzymes:  Recent Labs Lab 04/24/13 2022 04/25/13 0450  CKTOTAL 631* 564*  TROPONINI <0.30  --    BNP (last 3 results) No results found for this basename: PROBNP,  in the last 8760 hours CBG:  Recent Labs Lab 04/26/13 1643 04/26/13 2144 04/27/13 0047 04/27/13 0747 04/27/13 1146  GLUCAP 96 69* 115* 78 113*    No results found for this or any previous visit (from the past 240 hour(s)).   Studies: No results found.  Scheduled Meds: . clopidogrel  75 mg Oral Q breakfast  . enoxaparin (LOVENOX) injection  40 mg Subcutaneous Q24H  .  insulin aspart  0-9 Units Subcutaneous TID WC  . insulin aspart  3 Units Subcutaneous TID WC  . lisinopril  40 mg Oral Daily  . PARoxetine  20 mg Oral Daily   Continuous Infusions: . sodium chloride 75 mL/hr at 04/26/13 4098    Principal Problem:   CVA (cerebral infarction) Active Problems:   Accelerated hypertension   Leukocytosis   Depression   Type II or unspecified type diabetes mellitus with neurological manifestations, not stated as uncontrolled(250.60)   Hypokalemia    Time spent: 35 minutes.    Chaya Jan  [facility] Pager [phone]  If 7PM-7AM, please contact night-coverage at [email or web address], password [facility] 04/27/2013, 1:48 PM  LOS: 3 days

## 2013-04-27 NOTE — Progress Notes (Signed)
DOCTOR :please note: pt has ? Growth/debris on scrotum.   Not tender,noswelling,no drainage noted. Pt unable give any history.

## 2013-04-27 NOTE — Progress Notes (Signed)
Clinical Social Work Department BRIEF PSYCHOSOCIAL ASSESSMENT 04/27/2013  Patient:  Ryan Brady, Ryan Brady     Account Number:  [id number]     Admit date:  04/24/2013  Clinical Social Worker:  Dennison Bulla  Date/Time:  04/27/2013 01:45 PM  Referred by:  Physician  Date Referred:  04/27/2013 Referred for  SNF Placement   Other Referral:   Interview type:  Patient Other interview type:    PSYCHOSOCIAL DATA Living Status:  ALONE Admitted from facility:   Level of care:   Primary support name:  Ryan Brady Primary support relationship to patient:  CHILD, ADULT Degree of support available:   Strong    CURRENT CONCERNS Current Concerns  Post-Acute Placement   Other Concerns:    SOCIAL WORK ASSESSMENT / PLAN CSW received referral to assist with DC planning. CSW reviewed chart and met with patient at bedside. CSW introduced myself and explained role.    Patient lives alone but has son that lives in the same neighborhood that checks on him often. Patient's son is legally blind and cannot drive so he is unable to visit patient at the hospital. CSW explained PT recommendations for CIR vs SNF. CSW explained if CIR is unable to accept then patient has alternative option for SNF placement. CSW provided patient with SNF list and explained Medicare benefits for SNF. Patient agreeable to plan and asked CSW to explain DC plans to son as well.    CSW spoke with son via phone. Son reports he is anxious to speak with MD and is concerned about DC plans. CSW explained CIR vs SNF and son prefers CIR but agreeable to SNF as alternative option. Son reports he will visit patient tonight so they can discuss in further detail.    CSW completed FL2 and faxed out. CSW will follow up with bed offers if CIR is unable to accept patient.   Assessment/plan status:  Psychosocial Support/Ongoing Assessment of Needs Other assessment/ plan:   Information/referral to community resources:   SNF vs CIR information     PATIENT'S/FAMILY'S RESPONSE TO PLAN OF CARE: Patient alert and engaged in assessment. Patient thanked CSW for time and agreeable to son's involvement. Son polite and welcomes CSW follow up to assist with DC planning.       (Coverage for Unice Bailey)

## 2013-04-27 NOTE — Progress Notes (Signed)
Speech Language Pathology Dysphagia Treatment Patient Details Name: Ryan Brady MRN: [id number] DOB: 1946-09-30 Today's Date: 04/27/2013 Time: 4098-1191 SLP Time Calculation (min): 20 min  Assessment / Plan / Recommendation Clinical Impression  Pt seen for skilled dysphagia treatment to assess tolerance of diet and readiness for dietary advancement.  Pt denies changes in swallowing with new CVA but continues to demonstrate delayed oral transit and decreased efficiency in mastication with oral stasis without awareness. Liquid consumption aids clearance.  Voice again remains clear during po intake- indicative of adequacy of airway protection.  Recommend advance diet to mechanical soft/ (dys3/thin) with aspiration precautions.      Diet Recommendation  Initiate / Change Diet: Dysphagia 3 (mechanical soft);Thin liquid    SLP Plan     Pertinent Vitals/Pain Afebrile, decreased   Swallowing Goals  SLP Swallowing Goals Patient will consume recommended diet without observed clinical signs of aspiration with: Supervision/safety;Minimal assistance Swallow Study Goal #1 - Progress: Progressing toward goal Patient will utilize recommended strategies during swallow to increase swallowing safety with: Supervision/safety;Moderate assistance Swallow Study Goal #2 - Progress: Progressing toward goal  General Temperature Spikes Noted: No Respiratory Status: Room air Behavior/Cognition: Alert;Cooperative;Requires cueing;Decreased sustained attention Oral Cavity - Dentition: Poor condition;Missing dentition Patient Positioning: Upright in bed  Oral Cavity - Oral Hygiene   clear  Dysphagia Treatment Treatment focused on: Skilled observation of diet tolerance;Upgraded PO texture trials;Patient/family/caregiver education Family/Caregiver Educated: pt Treatment Methods/Modalities: Skilled observation Patient observed directly with PO's: Yes Type of PO's observed: Regular;Thin liquids Feeding: Able to  feed self Liquids provided via: Straw Oral Phase Signs & Symptoms: Prolonged bolus formation;Prolonged oral phase Type of cueing: Verbal Amount of cueing: Minimal   GO     Donavan Burnet, MS [facility] SLP [phone]

## 2013-04-27 NOTE — Progress Notes (Signed)
Rehab Admissions Coordinator Note:  Patient was screened by Brock Ra for appropriateness for an Inpatient Acute Rehab Consult.  At this time, there is  already an order for an Inpatient Rehab consult.  Brock Ra 04/27/2013, 8:16 AM  I can be reached at [phone].

## 2013-04-28 ENCOUNTER — Inpatient Hospital Stay (HOSPITAL_COMMUNITY)
Admission: RE | Admit: 2013-04-28 | Discharge: 2013-05-08 | DRG: 945 | Disposition: A | Discharge status: Home Health | Payer: Medicare Other | Source: Intra-hospital | Attending: Physical Medicine & Rehabilitation | Admitting: Physical Medicine & Rehabilitation

## 2013-04-28 ENCOUNTER — Encounter (HOSPITAL_COMMUNITY): Payer: Self-pay | Admitting: *Deleted

## 2013-04-28 DIAGNOSIS — I69993 Ataxia following unspecified cerebrovascular disease: Secondary | ICD-10-CM

## 2013-04-28 DIAGNOSIS — R066 Hiccough: Secondary | ICD-10-CM

## 2013-04-28 DIAGNOSIS — R1310 Dysphagia, unspecified: Secondary | ICD-10-CM

## 2013-04-28 DIAGNOSIS — I6330 Cerebral infarction due to thrombosis of unspecified cerebral artery: Secondary | ICD-10-CM

## 2013-04-28 DIAGNOSIS — H409 Unspecified glaucoma: Secondary | ICD-10-CM

## 2013-04-28 DIAGNOSIS — F17200 Nicotine dependence, unspecified, uncomplicated: Secondary | ICD-10-CM

## 2013-04-28 DIAGNOSIS — E1149 Type 2 diabetes mellitus with other diabetic neurological complication: Secondary | ICD-10-CM

## 2013-04-28 DIAGNOSIS — R32 Unspecified urinary incontinence: Secondary | ICD-10-CM

## 2013-04-28 DIAGNOSIS — F329 Major depressive disorder, single episode, unspecified: Secondary | ICD-10-CM

## 2013-04-28 DIAGNOSIS — Z8673 Personal history of transient ischemic attack (TIA), and cerebral infarction without residual deficits: Secondary | ICD-10-CM

## 2013-04-28 DIAGNOSIS — F3289 Other specified depressive episodes: Secondary | ICD-10-CM

## 2013-04-28 DIAGNOSIS — Z5189 Encounter for other specified aftercare: Principal | ICD-10-CM

## 2013-04-28 DIAGNOSIS — I10 Essential (primary) hypertension: Secondary | ICD-10-CM

## 2013-04-28 DIAGNOSIS — E876 Hypokalemia: Secondary | ICD-10-CM

## 2013-04-28 DIAGNOSIS — E1142 Type 2 diabetes mellitus with diabetic polyneuropathy: Secondary | ICD-10-CM

## 2013-04-28 DIAGNOSIS — R471 Dysarthria and anarthria: Secondary | ICD-10-CM

## 2013-04-28 LAB — CBC
MCV: 86.6 fL (ref 78.0–100.0)
Platelets: 267 10*3/uL (ref 150–400)
RBC: 4.25 MIL/uL (ref 4.22–5.81)
RDW: 18.8 % — ABNORMAL HIGH (ref 11.5–15.5)
WBC: 12.7 10*3/uL — ABNORMAL HIGH (ref 4.0–10.5)

## 2013-04-28 LAB — GLUCOSE, CAPILLARY: Glucose-Capillary: 90 mg/dL (ref 70–99)

## 2013-04-28 LAB — CREATININE, SERUM
Creatinine, Ser: 1.17 mg/dL (ref 0.50–1.35)
GFR calc Af Amer: 72 mL/min — ABNORMAL LOW (ref >90)

## 2013-04-28 MED ORDER — PAROXETINE HCL 20 MG PO TABS
20.0000 mg | ORAL_TABLET | Freq: Every day | ORAL | Status: DC
  Administered 2013-04-29 – 2013-05-08 (×10): 20 mg via ORAL
  Filled 2013-04-28 (×13): qty 1

## 2013-04-28 MED ORDER — CLOPIDOGREL BISULFATE 75 MG PO TABS
75.0000 mg | ORAL_TABLET | Freq: Every day | ORAL | Status: DC
  Administered 2013-04-29 – 2013-05-08 (×10): 75 mg via ORAL
  Filled 2013-04-28 (×13): qty 1

## 2013-04-28 MED ORDER — ONDANSETRON HCL 4 MG PO TABS: 4.0000 mg | ORAL_TABLET | Freq: Four times a day (QID) | ORAL | Status: DC | PRN

## 2013-04-28 MED ORDER — ENOXAPARIN SODIUM 40 MG/0.4ML ~~LOC~~ SOLN: 40.0000 mg | SUBCUTANEOUS | Status: DC

## 2013-04-28 MED ORDER — SENNOSIDES-DOCUSATE SODIUM 8.6-50 MG PO TABS: 1.0000 | ORAL_TABLET | Freq: Every evening | ORAL | Status: DC | PRN

## 2013-04-28 MED ORDER — LISINOPRIL 40 MG PO TABS
40.0000 mg | ORAL_TABLET | Freq: Every day | ORAL | Status: DC
  Administered 2013-04-29 – 2013-05-08 (×10): 40 mg via ORAL
  Filled 2013-04-28 (×13): qty 1

## 2013-04-28 MED ORDER — ONDANSETRON HCL 4 MG/2ML IJ SOLN: 4.0000 mg | Freq: Four times a day (QID) | INTRAMUSCULAR | Status: DC | PRN

## 2013-04-28 MED ORDER — SORBITOL 70 % SOLN: 30.0000 mL | Freq: Every day | Status: DC | PRN

## 2013-04-28 MED ORDER — ACETAMINOPHEN 325 MG PO TABS
325.0000 mg | ORAL_TABLET | ORAL | Status: DC | PRN
  Administered 2013-05-06: 650 mg via ORAL
  Filled 2013-04-28: qty 2

## 2013-04-28 MED ORDER — INSULIN ASPART 100 UNIT/ML ~~LOC~~ SOLN
0.0000 [IU] | Freq: Three times a day (TID) | SUBCUTANEOUS | Status: DC
  Administered 2013-04-29: 1 [IU] via SUBCUTANEOUS

## 2013-04-28 MED ORDER — ENOXAPARIN SODIUM 40 MG/0.4ML ~~LOC~~ SOLN
40.0000 mg | SUBCUTANEOUS | Status: DC
  Administered 2013-04-29 – 2013-05-07 (×9): 40 mg via SUBCUTANEOUS
  Filled 2013-04-28 (×10): qty 0.4

## 2013-04-28 NOTE — Discharge Summary (Signed)
Physician Discharge Summary  Kalup Jaquith ZOX:[id number] DOB: May 08, 1946 DOA: 04/24/2013  PCP: No primary provider on file.  Admit date: 04/24/2013 Discharge date: 04/28/2013  Time spent: greater than 30 minutes  Recommendations for Outpatient Follow-up:  -To CIR today.   Discharge Diagnoses:  Principal Problem:   CVA (cerebral infarction) Active Problems:   Accelerated hypertension   Leukocytosis   Depression   Type II or unspecified type diabetes mellitus with neurological manifestations, not stated as uncontrolled(250.60)   Hypokalemia   Discharge Condition: Stable and improved.  Filed Weights   04/25/13 0005  Weight: 76.2 kg (167 lb 15.9 oz)    History of present illness:  Patient is 67 year old male with past medical history of CVA, hypertension who presented to [facility] ED at the concern of his family that has found him lying in bed, weak and with difficulty expressing himself. In ED, patient remains aphasic but is awake and alert. Patient did report he fell at home. No current complaints of pain but he reports being weak. No chest pain, no shortness of breath and no palpitations. No abdominal pain, no nausea or vomiting. No lightheadedness. No reports of loss of consciousness. No loss of sensation. No blurry vision. No headaches. No fever or chills. We were asked to admit him for further evaluation and management.   Hospital Course:   CVA  -MRI confirms an acute right MCA CVA.  -Has had prior CVAs.  -MRA with high-grade stenosis of M1 segment of the left MCA. This has been discussed via phone with neurology, Dr. Loretha Brasil, and has been decided that no further intervention required (opposite side of current CVA).  -Carotid Dopplers: There is 0-39% right ICA stenosis. Left ICA flow not visualized past the very proximal portion, question occlusion. Technically difficult study secondary to constant hiccups and inability to keep head turned. Vertebral artery flow is antegrade.  -2D  ECHO: Study Conclusions Left ventricle: The cavity size was normal. Wall thickness was increased in a pattern of mild LVH. Systolic function was vigorous. The estimated ejection fraction was in the range of 65% to 70%. Wall motion was normal; there were no regional wall motion abnormalities. Doppler parameters are consistent with abnormal left ventricular relaxation (grade 1 diastolic dysfunction). -PT/OT/ evals rec CIR.  -ASA has been upgraded to plavix for secondary stroke prevention.  -LDL 92, no statin required.   HTN  -Allow permissive HTN given recent CVA.  -BP with fair control currently.   DM  -?new diagnosis. Not on meds prior to admission.  -A1c is 5.6.  -Has had some hypoglycemic events.  -Will DC SSI.  -Well controlled.   Hypokalemia  -Repleted.  -Mg ok at 2.2.   Procedures:  None   Consultations:  None  Discharge Instructions     Medication List         aspirin EC 81 MG tablet  Take 81 mg by mouth daily.     lisinopril 40 MG tablet  Commonly known as:  PRINIVIL,ZESTRIL  Take 1 tablet (40 mg total) by mouth daily.     PARoxetine 20 MG tablet  Commonly known as:  PAXIL  Take 1 tablet (20 mg total) by mouth every morning.       No Known Allergies    The results of significant diagnostics from this hospitalization (including imaging, microbiology, ancillary and laboratory) are listed below for reference.    Significant Diagnostic Studies: Dg Chest 2 View  04/25/2013   *RADIOLOGY REPORT*  Clinical Data: Stroke.  Diabetes  CHEST - 2 VIEW  Comparison: 04/24/2013  Findings: Normal heart size.  No pleural effusion or edema.  No airspace consolidation noted.  Review of the visualized osseous structures is unremarkable.  IMPRESSION:  1.  No acute cardiopulmonary abnormalities.   Original Report Authenticated By: Signa Kell, M.D.   Ct Head Wo Contrast  04/24/2013   *RADIOLOGY REPORT*  Clinical Data:  New weakness, fell 1 day ago, history of stroke,  diabetes, hypertension  CT HEAD WITHOUT CONTRAST  Technique:  Contiguous axial images were obtained from the base of the skull through the vertex without contrast.  Comparison: None  Findings: Generalized atrophy. Normal ventricular morphology. No midline shift or mass effect. Small vessel chronic ischemic changes of deep cerebral white matter. Old bilateral basal ganglia lacunar infarcts. No intracranial hemorrhage, mass lesion or evidence of acute infarction. No extra-axial fluid collections. Bones and sinuses unremarkable.  IMPRESSION: Atrophy with small vessel chronic ischemic changes of deep cerebral white matter. Old bilateral basal ganglia lacunar infarcts. No acute intracranial abnormalities.   Original Report Authenticated By: Ulyses Southward, M.D.   Mr [facility] Wo Contrast  04/25/2013   *RADIOLOGY REPORT*  Clinical Data:  The patient was found lying in bed, weak with difficulty speaking.  Stroke risk factors include prior CVA, hypertension, and diabetes mellitus.  MRI HEAD WITHOUT CONTRAST MRA HEAD WITHOUT CONTRAST  Technique:  Multiplanar, multiecho pulse sequences of the brain and surrounding structures were obtained without intravenous contrast. Angiographic images of the head were obtained using MRA technique without contrast.  Comparison:  CT head 04/24/2013.  MRI HEAD  Findings:  There is an acute right MCA territory infarct affecting the lenticular nucleus, primarily putamen, posterior limb internal capsule, and periventricular white matter.  There is no associated hemorrhage. Moderate atrophy. Moderate small vessel disease.  Bilateral chronic lacunar infarcts affect the right and left basal ganglia.  No foci of chronic hemorrhage.  No mass lesion, hydrocephalus, or extra-axial fluid.  Unremarkable pituitary and cerebellar tonsils.  Diminished flow void left internal carotid artery.  No acute osseous abnormalities. Moderate cervical spondylosis.  No acute sinus or mastoid disease.  IMPRESSION: Acute  right lenticulostriate territory infarct as described. Multiple foci of chronic ischemia.  Atrophy and small vessel disease.  MRA HEAD  Findings: There is asymmetric diminished flow related enhancement in the petrous and cavernous left internal carotid artery; suspected focal inferior cavernous stenosis approaching 75%.  There is a similar 50% stenosis inferior cavernous segment right internal carotid artery.  The basilar artery is widely patent, with left vertebral dominant but right vertebral contributing.   Normal right M1 MCA.  Subtotal occlusion left A1 ACA.  Both distal anterior cerebral arteries fill from the right.   There is poor flow related enhancement into the left middle cerebral artery territory.  There is a high-grade stenosis or focal occlusion at the left distal M1 MCA segment.  No appreciable flow related enhancement into the M2 and M3 vessels.  These do however appear patent on routine MR but flow appears to be diminished to a degree that MRA is not sensitive.  No proximal PCA stenosis.  Mild irregularity distal right greater than left PCAs.  No cerebellar branch occlusion.  No intracranial berry aneurysm.  IMPRESSION: Subtotal occlusion left A1 ACA.  Suspected 75% stenosis inferior cavernous segment left internal carotid artery with diminished flow related enhancement in the petrous and supraclinoid segments.  Apparent high-grade stenosis or occlusion M1 segment left MCA, with reduced or absent flow related enhancement  of the M2 and M3 vessels.  Please note that the patient's acute infarction affects the right hemisphere; it is not clear that this is an acute abnormality.   Original Report Authenticated By: Davonna Belling, M.D.   Mr Brain Wo Contrast  04/25/2013   *RADIOLOGY REPORT*  Clinical Data:  The patient was found lying in bed, weak with difficulty speaking.  Stroke risk factors include prior CVA, hypertension, and diabetes mellitus.  MRI HEAD WITHOUT CONTRAST MRA HEAD WITHOUT CONTRAST   Technique:  Multiplanar, multiecho pulse sequences of the brain and surrounding structures were obtained without intravenous contrast. Angiographic images of the head were obtained using MRA technique without contrast.  Comparison:  CT head 04/24/2013.  MRI HEAD  Findings:  There is an acute right MCA territory infarct affecting the lenticular nucleus, primarily putamen, posterior limb internal capsule, and periventricular white matter.  There is no associated hemorrhage. Moderate atrophy. Moderate small vessel disease.  Bilateral chronic lacunar infarcts affect the right and left basal ganglia.  No foci of chronic hemorrhage.  No mass lesion, hydrocephalus, or extra-axial fluid.  Unremarkable pituitary and cerebellar tonsils.  Diminished flow void left internal carotid artery.  No acute osseous abnormalities. Moderate cervical spondylosis.  No acute sinus or mastoid disease.  IMPRESSION: Acute right lenticulostriate territory infarct as described. Multiple foci of chronic ischemia.  Atrophy and small vessel disease.  MRA HEAD  Findings: There is asymmetric diminished flow related enhancement in the petrous and cavernous left internal carotid artery; suspected focal inferior cavernous stenosis approaching 75%.  There is a similar 50% stenosis inferior cavernous segment right internal carotid artery.  The basilar artery is widely patent, with left vertebral dominant but right vertebral contributing.   Normal right M1 MCA.  Subtotal occlusion left A1 ACA.  Both distal anterior cerebral arteries fill from the right.   There is poor flow related enhancement into the left middle cerebral artery territory.  There is a high-grade stenosis or focal occlusion at the left distal M1 MCA segment.  No appreciable flow related enhancement into the M2 and M3 vessels.  These do however appear patent on routine MR but flow appears to be diminished to a degree that MRA is not sensitive.  No proximal PCA stenosis.  Mild irregularity  distal right greater than left PCAs.  No cerebellar branch occlusion.  No intracranial berry aneurysm.  IMPRESSION: Subtotal occlusion left A1 ACA.  Suspected 75% stenosis inferior cavernous segment left internal carotid artery with diminished flow related enhancement in the petrous and supraclinoid segments.  Apparent high-grade stenosis or occlusion M1 segment left MCA, with reduced or absent flow related enhancement of the M2 and M3 vessels.  Please note that the patient's acute infarction affects the right hemisphere; it is not clear that this is an acute abnormality.   Original Report Authenticated By: Davonna Belling, M.D.   Dg Chest Port 1 View  04/24/2013   *RADIOLOGY REPORT*  Clinical Data: Weakness.  PORTABLE CHEST - 1 VIEW  Comparison: None.  Findings: Semi upright view of the chest was obtained.  There is soft tissue fullness in the right hilum.  Heart size is normal.  No focal lung disease or edema.  The trachea is midline.  IMPRESSION: Soft tissue fullness in the right hilum.  Findings could be vascular in etiology but cannot exclude lymphadenopathy. Recommend further evaluation with a two-view chest exam or chest CT.  No focal lung disease.   Original Report Authenticated By: Richarda Overlie, M.D.  Microbiology: No results found for this or any previous visit (from the past 240 hour(s)).   Labs: Basic Metabolic Panel:  Recent Labs Lab 04/24/13 2022 04/24/13 2027 04/26/13 0442 04/27/13 0442  NA 136 140 136 136  K 3.9 3.9 3.3* 3.6  CL 95* 103 104 103  CO2 23  --  25 23  GLUCOSE 135* 137* 89 87  BUN 21 23 17 12   CREATININE 1.10 1.10 1.16 1.06  CALCIUM 10.2  --  8.4 8.8  MG  --   --  2.2  --    Liver Function Tests:  Recent Labs Lab 04/24/13 2022  AST 27  ALT 19  ALKPHOS 171*  BILITOT 1.0  PROT 9.0*  ALBUMIN 4.4   No results found for this basename: LIPASE, AMYLASE,  in the last 168 hours No results found for this basename: AMMONIA,  in the last 168 hours CBC:  Recent  Labs Lab 04/24/13 2022 04/24/13 2027 04/26/13 0442  WBC 17.3*  --  11.4*  NEUTROABS 12.5*  --   --   HGB 16.2 18.0* 11.5*  HCT 47.1 53.0* 35.0*  MCV 87.2  --  87.5  PLT 417*  --  259   Cardiac Enzymes:  Recent Labs Lab 04/24/13 2022 04/25/13 0450  CKTOTAL 631* 564*  TROPONINI <0.30  --    BNP: BNP (last 3 results) No results found for this basename: PROBNP,  in the last 8760 hours CBG:  Recent Labs Lab 04/27/13 1146 04/27/13 1626 04/27/13 2132 04/28/13 0759 04/28/13 1139  GLUCAP 113* 119* 108* 90 117*       Signed:  HERNANDEZ ACOSTA,ESTELA  [facility] Pager: [phone] 04/28/2013, 2:03 PM

## 2013-04-28 NOTE — H&P (Signed)
Physical Medicine and Rehabilitation Admission H&P      Chief Complaint   Patient presents with   .  Weakness   .  Fall    : HPI: Ryan Brady is a 67 y.o. right-handed male with history of hypertension, diabetes mellitus and CVA. Admitted 04/24/2013 with difficulty speaking. Patient independent prior to admission without the use of assistive device. MRI of the brain shows acute right MCA territory infarct as well as bilateral chronic lacunar infarcts affecting the right and left basal ganglia. MRA of the head was subtotal occlusion left A1 ACA. Echocardiogram with ejection fraction of 70% grade 1 diastolic dysfunction without emboli. Carotid Dopplers with 0-39% right ICA stenosis. Left ICA flow not visualized past the very proximal portion, question occlusion. Patient did not receive TPA. Placed on Plavix therapy for CVA prophylaxis as well as subcutaneous Lovenox for DVT prophylaxis. Patient had been on aspirin prior to admission. Currently maintained on a dysphagia 2 thin liquid diet. Physical and occupational therapy evaluations completed with recommendations of physical medicine rehabilitation consult to consider inpatient rehabilitation services. Patient was felt to be a good candidate for inpatient rehabilitation services and was admitted for comprehensive rehabilitation program   Review of Systems   Unable to perform ROS      Past Medical History   Diagnosis  Date   .  Stroke     .  Diabetes mellitus without complication     .  Hypertension      Past Surgical History   Procedure  Laterality  Date   .  Tonsillectomy        History reviewed. No pertinent family history. Social History: reports that he has been smoking.  He has never used smokeless tobacco. He reports that he does not drink alcohol or use illicit drugs. Allergies: No Known Allergies Medications Prior to Admission   Medication  Sig  Dispense  Refill   .  aspirin EC 81 MG tablet  Take 81 mg by mouth daily.          Marland Kitchen  lisinopril (PRINIVIL,ZESTRIL) 40 MG tablet  Take 1 tablet (40 mg total) by mouth daily.   30 tablet   1   .  PARoxetine (PAXIL) 20 MG tablet  Take 1 tablet (20 mg total) by mouth every morning.   30 tablet   0      Home: Home Living Family/patient expects to be discharged to:: Private residence Living Arrangements: Alone Available Help at Discharge: Family;Available PRN/intermittently Type of Home: Apartment Home Access: Level entry Home Layout: One level Home Equipment: None    Functional History: Prior Function Comments: Pt does not drive due to glaucoma.  But was independent with all IADLs   Functional Status:   Mobility: Bed Mobility Bed Mobility: Supine to Sit Supine to Sit: 4: Min assist Transfers Transfers: Sit to Stand;Stand to Sit Sit to Stand: 4: Min assist;From chair/3-in-1;From toilet;With upper extremity assist Stand to Sit: 4: Min assist;With upper extremity assist;To chair/3-in-1;To toilet Ambulation/Gait Ambulation/Gait Assistance: 3: Mod assist Ambulation Distance (Feet): 150 Feet Assistive device: Rolling walker Ambulation/Gait Assistance Details: occasionally needs assist with directing RW, needs more assist to maintain balance on turns Gait Pattern: Step-through pattern;Decreased step length - right;Ataxic Gait velocity: decreased General Gait Details: mild ataxia Stairs: No Wheelchair Mobility Wheelchair Mobility: No   ADL: ADL Eating/Feeding: Supervision/safety Where Assessed - Eating/Feeding: Chair Grooming: Wash/dry hands;Moderate assistance (for thoroughness) Where Assessed - Grooming: Unsupported standing Upper Body Bathing: Moderate assistance Where Assessed -  Upper Body Bathing: Unsupported sitting Lower Body Bathing: Moderate assistance Where Assessed - Lower Body Bathing: Unsupported sit to stand Upper Body Dressing: Moderate assistance Where Assessed - Upper Body Dressing: Unsupported sitting Lower Body Dressing: Maximal  assistance Where Assessed - Lower Body Dressing: Supported sit to stand Toilet Transfer: Minimal assistance Toilet Transfer Method: Sit to stand;Stand pivot Toilet Transfer Equipment: Comfort height toilet Transfers/Ambulation Related to ADLs: min A ADL Comments: Pt able to doff Rt. sock and don with min A and increased time.  required cues for problem solving.  Unable to doff Lt. sock due inablity to problem solve through alternatives when he was unsuccessful and fatigue.  Pt. noted to fatigue as assist was provided to clean peri area (due to incontinence).  Pt. flexing knees and fatiguing when standing.   Pt requires assist with bathing due to decreased thoroughness and problem solving deficits   Cognition: Cognition Overall Cognitive Status: No family/caregiver present to determine baseline cognitive functioning (suspect different from baseline) Orientation Level: Oriented to person;Oriented to place Attention: Sustained Sustained Attention: Impaired Sustained Attention Impairment: Functional basic Memory: Impaired (pt did not recall his home address) Memory Impairment: Storage deficit;Retrieval deficit;Decreased recall of new information;Decreased short term memory;Prospective memory Decreased Short Term Memory: Verbal basic;Functional basic Awareness: Impaired Awareness Impairment: Intellectual impairment;Emergent impairment Problem Solving: Impaired Problem Solving Impairment: Verbal basic;Functional basic Behaviors: Restless;Impulsive;Perseveration;Lability Safety/Judgment: Impaired (pt requests to return home, denies need for rehab) Cognition Arousal/Alertness: Awake/alert Behavior During Therapy: Flat affect (son reports this is pt baseline) Overall Cognitive Status: No family/caregiver present to determine baseline cognitive functioning (suspect different from baseline) Area of Impairment: Orientation;Attention;Awareness;Problem solving Orientation Level: Disoriented  to;Place Current Attention Level: Sustained Awareness: Intellectual Problem Solving: Slow processing;Requires verbal cues;Requires tactile cues General Comments: pt cooperative   Physical Exam: Blood pressure 141/82, pulse 84, temperature 99 F (37.2 C), temperature source Oral, resp. rate 18, height 5\' 9"  (1.753 m), weight 76.2 kg (167 lb 15.9 oz), SpO2 98.00%.   Physical Exam  Vitals reviewed.   HENT:   Head: Normocephalic.   Eyes: EOM are normal.   Neck: Normal range of motion. Neck supple. No thyromegaly present.   Cardiovascular: Normal rate and regular rhythm.   Pulmonary/Chest: Effort normal and breath sounds normal. No respiratory distress.   Abdominal: Soft. Bowel sounds are normal. He exhibits no distension.  Neurological: He is alert.  Patient is aphasic and and limited the following simple commands. Poor awareness of deficits and decreased attention Skin: Skin is warm and dry.   visual fields are intact the confrontational testing, left neglect noted with double simultaneous stimulation   Neuro:   Eyes without evidence of nystagmus   Tone is normal without evidence of spasticity   Cerebellar exam shows no evidence of ataxia on finger nose finger or heel to shin testing   No evidence of trunkal ataxia   Motor strength is 5/5 in bilateral deltoid, biceps, triceps, finger flexors and extensors, wrist flexors and extensors, hip flexors, knee flexors and extensors, ankle dorsiflexors, plantar flexors, invertors and evertors, toe flexors and extensors Cranial nerves   II- Visual fields are intact to confrontation testing, no blurring of vision   III- no evidence of ptosis, upward, downward and medial gaze intact   IV- no vertical diplopia or head tilt   V- no facial numbness or masseter weakness   VI- no pupil abduction weakness   VII- no facial droop, good lid closure   VII- normal auditory acuity   IX- no pharygeal  weakness, gag nl   X- no pharyngeal weakness, no  hoarseness   XI- no trap or SCM weakness   XII- no glossal weakness    Results for orders placed during the hospital encounter of 04/24/13 (from the past 48 hour(s))   GLUCOSE, CAPILLARY     Status: None     Collection Time      04/26/13  7:30 AM       Result  Value  Range     Glucose-Capillary  86   70 - 99 mg/dL   GLUCOSE, CAPILLARY     Status: None     Collection Time      04/26/13 11:31 AM       Result  Value  Range     Glucose-Capillary  90   70 - 99 mg/dL   GLUCOSE, CAPILLARY     Status: None     Collection Time      04/26/13  4:43 PM       Result  Value  Range     Glucose-Capillary  96   70 - 99 mg/dL   GLUCOSE, CAPILLARY     Status: Abnormal     Collection Time      04/26/13  9:44 PM       Result  Value  Range     Glucose-Capillary  69 (*)  70 - 99 mg/dL   GLUCOSE, CAPILLARY     Status: Abnormal     Collection Time      04/27/13 12:47 AM       Result  Value  Range     Glucose-Capillary  115 (*)  70 - 99 mg/dL   BASIC METABOLIC PANEL     Status: Abnormal     Collection Time      04/27/13  4:42 AM       Result  Value  Range     Sodium  136   135 - 145 mEq/L     Potassium  3.6   3.5 - 5.1 mEq/L     Chloride  103   96 - 112 mEq/L     CO2  23   19 - 32 mEq/L     Glucose, Bld  87   70 - 99 mg/dL     BUN  12   6 - 23 mg/dL     Creatinine, Ser  7.84   0.50 - 1.35 mg/dL     Calcium  8.8   8.4 - 10.5 mg/dL     GFR calc non Af Amer  70 (*)  >90 mL/min     GFR calc Af Amer  81 (*)  >90 mL/min     Comment:                The eGFR has been calculated        using the CKD EPI equation.        This calculation has not been        validated in all clinical        situations.        eGFR's persistently        <90 mL/min signify        possible Chronic Kidney Disease.   GLUCOSE, CAPILLARY     Status: None     Collection Time      04/27/13  7:47 AM       Result  Value  Range     Glucose-Capillary  78  70 - 99 mg/dL   GLUCOSE, CAPILLARY     Status: Abnormal      Collection Time      04/27/13 11:46 AM       Result  Value  Range     Glucose-Capillary  113 (*)  70 - 99 mg/dL   GLUCOSE, CAPILLARY     Status: Abnormal     Collection Time      04/27/13  4:26 PM       Result  Value  Range     Glucose-Capillary  119 (*)  70 - 99 mg/dL   GLUCOSE, CAPILLARY     Status: Abnormal     Collection Time      04/27/13  9:32 PM       Result  Value  Range     Glucose-Capillary  108 (*)  70 - 99 mg/dL    No results found.   Post Admission Physician Evaluation: Functional deficits secondary  to right MCA infarct, thrombotic. Patient is admitted to receive collaborative, interdisciplinary care between the physiatrist, rehab nursing staff, and therapy team. Patient's level of medical complexity and substantial therapy needs in context of that medical necessity cannot be provided at a lesser intensity of care such as a SNF. Patient has experienced substantial functional loss from his/her baseline which was documented above under the "Functional History" and "Functional Status" headings.  Judging by the patient's diagnosis, physical exam, and functional history, the patient has potential for functional progress which will result in measurable gains while on inpatient rehab.  These gains will be of substantial and practical use upon discharge  in facilitating mobility and self-care at the household level. Physiatrist will provide 24 hour management of medical needs as well as oversight of the therapy plan/treatment and provide guidance as appropriate regarding the interaction of the two. 24 hour rehab nursing will assist with bladder management, bowel management, safety, skin/wound care, disease management, medication administration and patient education  and help integrate therapy concepts, techniques,education, etc. PT will assess and treat for/with: Pre-gait training, gait training, endurance, safety, equipment, neuromuscular reeducation.   Goals are: Supervision with  mobility. OT will assess and treat for/with: ADLs, cognitive perceptual skills, neuromuscular reeducation, safety, equipment, endurance.   Goals are: Supervision with ADLs. SLP will assess and treat for/with: Medication management, left sided awareness.  Goals are: Improve left sided awareness, assist with medication management. Case Management and Social Worker will assess and treat for psychological issues and discharge planning. Team conference will be held weekly to assess progress toward goals and to determine barriers to discharge. Patient will receive at least 3 hours of therapy per day at least 5 days per week. ELOS: 10-12 days        Prognosis:  good     Medical Problem List and Plan: 1. Thrombotic right MCA infarct 2. DVT Prophylaxis/Anticoagulation: Subcutaneous Lovenox. Monitor platelet counts any signs of bleeding 3. Pain Management: Tylenol as needed   4. Dysphagia. Dysphagia to thin liquid diet. Monitor for any signs of aspiration and followup speech therapy 5. Mood/depression. Paxil 20 mg daily. Provide emotional support 6. Neuropsych: This patient is not capable of making decisions on his own behalf. 7. Hypertension. Lisinopril 40 mg daily. Monitor with increased activity 8. Diabetes mellitus. Hemoglobin A1c 5.6. Patient is diet controlled. Check blood sugars a.c. and at bedtime   Erick Colace M.D. [facility] FAAPM&R (Sports Med, Neuromuscular Med) Diplomate Am Board of Electrodiagnostic Med Diplomate Am  Board of Pain Medicine Fellow Am Board of Interventional Pain Physicians 04/28/2013

## 2013-04-28 NOTE — PMR Pre-admission (Signed)
PMR Admission Coordinator Pre-Admission Assessment  Patient: Ryan Brady is an 67 y.o., male MRN: [id number] DOB: 1946-04-13 Height: 5\' 9"  (175.3 cm) Weight: 76.2 kg (167 lb 15.9 oz)              Insurance Information Primary: Medicare Policy#: [id number] a Subscriber: Patient Benefits: [employer] Effective Date: 08/22/05 Deductible: $1126 OOP Max: None Life Max: Unlimited CIR: 100% SNF: 100 days LBD: all Outpatient: 80% Copay: 20%  Home Health: 100% DME: 80%  Copay: 20% Providers: Patient's choice   Emergency Contact Information Contact Information   Name Relation Home Work Mobile   Frankewing Son [phone]       Current Medical History  Patient Admitting Diagnosis: (R) MCA infarct with (L) neglect  History of Present Illness: 67 y.o. right-handed male with history of hypertension, diabetes mellitus and CVA. Admitted 04/24/2013 with difficulty speaking. Patient independent prior to admission without the use of assistive device. MRI of the brain shows acute right MCA territory infarct as well as bilateral chronic lacunar infarcts affecting the right and left basal ganglia. MRA of the head was subtotal occlusion left A1 ACA. Echocardiogram with ejection fraction of 70% grade 1 diastolic dysfunction without emboli. Carotid Dopplers with 0-39% right ICA stenosis. Left ICA flow not visualized past the very proximal portion, question occlusion. Patient did not receive TPA. Placed on Plavix therapy for CVA prophylaxis as well as subcutaneous Lovenox for DVT prophylaxis. Patient had been on aspirin prior to admission. Currently maintained on a dysphagia 2 thin liquid diet.   Total: 7=NIH   Past Medical History  Past Medical History  Diagnosis Date  . Stroke   . Diabetes mellitus without complication   . Hypertension     Family History  family history is not on file.  Prior Rehab/Hospitalizations: 8 strokes in past. OP therapy.   Current Medications  Current facility-administered  medications:0.9 %  sodium chloride infusion, , Intravenous, Continuous, Rolan Lipa, NP, Last Rate: 75 mL/hr at 04/28/13 0345;  chlorproMAZINE (THORAZINE) injection 25 mg, 25 mg, Intramuscular, TID PRN, Henderson Cloud, MD, 25 mg at 04/28/13 0807;  clopidogrel (PLAVIX) tablet 75 mg, 75 mg, Oral, Q breakfast, Henderson Cloud, MD, 75 mg at 04/28/13 0808 enoxaparin (LOVENOX) injection 40 mg, 40 mg, Subcutaneous, Q24H, Estela Isaiah Blakes, MD, 40 mg at 04/28/13 1142;  insulin aspart (novoLOG) injection 0-9 Units, 0-9 Units, Subcutaneous, TID WC, Estela Isaiah Blakes, MD, 1 Units at 04/25/13 1317;  lisinopril (PRINIVIL,ZESTRIL) tablet 40 mg, 40 mg, Oral, Daily, Alison Murray, MD, 40 mg at 04/28/13 1030 PARoxetine (PAXIL) tablet 20 mg, 20 mg, Oral, Daily, Alison Murray, MD, 20 mg at 04/28/13 1030;  senna-docusate (Senokot-S) tablet 1 tablet, 1 tablet, Oral, QHS PRN, Alison Murray, MD  Patients Current Diet: Dysphagia 2, thin liquids  Precautions / Restrictions Precautions Precautions: Fall Precaution Comments: L sided weakness (UE >LE) and some mild L sided inattention.  Restrictions Weight Bearing Restrictions: No   Prior Activity Level Community (5-7x/wk): Active daily Home Assistive Devices / Equipment Home Assistive Devices/Equipment: None Home Equipment: None  Prior Functional Level Prior Function Level of Independence: Independent Comments: Pt does not drive due to glaucoma.  But was independent with all IADLs  Current Functional Level Cognition  Overall Cognitive Status: No family/caregiver present to determine baseline cognitive functioning (suspect different from baseline) Current Attention Level: Sustained Orientation Level: Oriented to person;Oriented to place General Comments: pt cooperative Attention: Sustained Sustained Attention: Impaired Sustained Attention Impairment: Functional  basic Memory: Impaired (pt did not recall his home  address) Memory Impairment: Storage deficit;Retrieval deficit;Decreased recall of new information;Decreased short term memory;Prospective memory Decreased Short Term Memory: Verbal basic;Functional basic Awareness: Impaired Awareness Impairment: Intellectual impairment;Emergent impairment Problem Solving: Impaired Problem Solving Impairment: Verbal basic;Functional basic Behaviors: Restless;Impulsive;Perseveration;Lability Safety/Judgment: Impaired (pt requests to return home, denies need for rehab)    Extremity Assessment (includes Sensation/Coordination)  Upper Extremity Assessment: LUE deficits/detail LUE Deficits / Details: Brunnstrom stage V.  Ulnar drift noted LUE Sensation:  (Pt denies sensory changes) LUE Coordination: decreased fine motor  Lower Extremity Assessment: Defer to PT evaluation LLE Deficits / Details: Pt with 5/5 strength in knee flex/ext and ankle PF/DF, note 4/5 strength at hip flex LLE Sensation: decreased light touch (somewhat decreased sensation at L3/4 level dermatomes)    ADLs  Eating/Feeding: Performed;Supervision/safety;Set up (mod cues for bite size and clearing pockets, alternating) Where Assessed - Eating/Feeding: Chair Grooming: Wash/dry hands;Moderate assistance (for thoroughness) Where Assessed - Grooming: Unsupported standing Upper Body Bathing: Moderate assistance Where Assessed - Upper Body Bathing: Unsupported sitting Lower Body Bathing: Moderate assistance Where Assessed - Lower Body Bathing: Unsupported sit to stand Upper Body Dressing: Moderate assistance Where Assessed - Upper Body Dressing: Unsupported sitting Lower Body Dressing: Maximal assistance Where Assessed - Lower Body Dressing: Supported sit to stand Toilet Transfer: Mining engineer Method: Surveyor, minerals:  (bed to chair) Toileting - Clothing Manipulation and Hygiene: Moderate assistance Where Assessed - Toileting Clothing  Manipulation and Hygiene: Standing Transfers/Ambulation Related to ADLs: cues for scootig forward ADL Comments: pt attended to bil sides of tray.  He used RUE but grasp was awkward.  Used built up foam which helped a little.  Turned off TV for increased attention to task.  Pt fatiquing near end of session.  Had planned to do oral care, but pt wanted to continue to work on coffee. Asked NT to do this when pt finishes coffee.  NT to check on pt.  Size of sips was appropriate.      Mobility  Bed Mobility: Supine to Sit Supine to Sit: 4: Min assist    Transfers  Transfers: Sit to Stand;Stand to Sit Sit to Stand: 4: Min assist;From bed;With upper extremity assist Stand to Sit: 4: Min assist;With upper extremity assist;To chair/3-in-1;To toilet    Ambulation / Gait / Stairs / Wheelchair Mobility  Ambulation/Gait Ambulation/Gait Assistance: 3: Mod assist Ambulation Distance (Feet): 150 Feet Assistive device: Rolling walker Ambulation/Gait Assistance Details: occasionally needs assist with directing RW, needs more assist to maintain balance on turns Gait Pattern: Step-through pattern;Decreased step length - right;Ataxic Gait velocity: decreased General Gait Details: mild ataxia Stairs: No Wheelchair Mobility Wheelchair Mobility: No    Posture / Balance Static Sitting Balance Static Sitting - Balance Support: No upper extremity supported Static Sitting - Level of Assistance: 5: Stand by assistance Static Standing Balance Static Standing - Balance Support: Bilateral upper extremity supported Static Standing - Level of Assistance: 5: Stand by assistance    Special needs/care consideration Continuous Drip IV: yes Skin: WDL Bowel mgmt:LBM 7/7 Bladder mgmt: Incontinent, using condom cath Diabetic mgmt: yes Safety Education on smoking cessation    Previous Home Environment Living Arrangements: Alone Available Help at Discharge: Family;Available PRN/intermittently Type of Home:  Apartment Home Layout: One level Home Access: Level entry Bathroom Shower/Tub: Tub/shower unit;Curtain Firefighter: Standard Home Care Services: No  Discharge Living Setting Plans for Discharge Living Setting: Home if able. Pt's son says pt may be  able to stay with him if needed. They live in the same apt complex. Son's apt is same set-up as pt's. Type of Home at Discharge: Apartment Discharge Home Layout: One level Discharge Home Access: Level entry Discharge Bathroom Shower/Tub: Tub/shower unit Discharge Bathroom Toilet: Standard Discharge Bathroom Accessibility: Yes Does the patient have any problems obtaining your medications?: No  Social/Family/Support Systems Patient Roles: Parent Contact Information: Home:[phone] Anticipated Caregiver: Son: Tajon Moring Anticipated Caregiver's Contact Information: [phone] Ability/Limitations of Caregiver: Visual deficits, cannot drive Caregiver Availability: Possibly 24/7 Discharge Plan Discussed with Primary Caregiver: Yes Is Caregiver In Agreement with Plan?: Yes Does Caregiver/Family have Issues with Lodging/Transportation while Pt is in Rehab?: Yes (Son is dependent on his wife for transportation.)  Goals/Additional Needs Patient/Family Goal for Rehab: Per MD: supervision. Pt hopes to be modified independent. Expected length of stay: 2 weeks Cultural Considerations: none Dietary Needs: Dys 2, thin liquids Equipment Needs: TBD Pt/Family Agrees to Admission and willing to participate: Yes.  Program Orientation Provided & Reviewed with Pt/Caregiver Including Roles  & Responsibilities: Yes   Decrease burden of Care through IP rehab admission: Diet advancement, Decrease number of caregivers, Bowel and bladder program and Patient/family education  Possible need for SNF placement upon discharge: Not planned, however, d/c plan is not confirmed.  Patient Condition: This patient's condition remains as documented in the consult dated  04/27/13, in which the Rehabilitation Physician determined and documented that the patient's condition is appropriate for intensive rehabilitative care in an inpatient rehabilitation facility. Will admit to inpatient rehab today.  Preadmission Screen Completed By:  Meryl Dare, 04/28/2013 11:57 AM ______________________________________________________________________   Discussed status with Dr. Wynn Banker on 04/28/13 at 1220 and received telephone approval for admission today.  Admission Coordinator:  Meryl Dare, time 1226/Date 04/28/13

## 2013-04-28 NOTE — Progress Notes (Signed)
Admitting pt to CIR today. Met with patient at bedside to evaluate for and discuss CIR. Pt would benefit from CIR and he is in agreement with plan. Called pt's son and he is in agreement as well. He reports that he and his wife are working on plans for pt's discharge and that it's possible that pt can stay with them if needed.  Dr Ardyth Harps reports pt is ready for d/c to CIR today. I can be reached at [phone]

## 2013-04-28 NOTE — Progress Notes (Signed)
Occupational Therapy Treatment Patient Details Name: Nicodemus Denk MRN: [id number] DOB: Sep 30, 1946 Today's Date: 04/28/2013 Time: 4098-1191 OT Time Calculation (min): 48 min  OT Assessment / Plan / Recommendation  OT comments  Continues to make progress.    Follow Up Recommendations  CIR;Supervision/Assistance - 24 hour    Barriers to Discharge       Equipment Recommendations  None recommended by OT    Recommendations for Other Services    Frequency Min 3X/week   Progress towards OT Goals Progress towards OT goals: Progressing toward goals  Plan Discharge plan remains appropriate    Precautions / Restrictions Precautions Precautions: Fall Restrictions Weight Bearing Restrictions: No   Pertinent Vitals/Pain No c/o pain   ADL  Eating/Feeding: Performed;Supervision/safety;Set up (mod cues for bite size and clearing pockets, alternating) Toilet Transfer: Simulated;Minimal assistance Toilet Transfer Method: Stand pivot Toilet Transfer Equipment:  (bed to chair) Transfers/Ambulation Related to ADLs: cues for scooting forward ADL Comments: pt attended to bil sides of tray.  He used RUE but grasp was awkward.  Used built up foam which helped a little.  Turned off TV for increased attention to task.  Pt fatiquing near end of session.  Had planned to do oral care, but pt wanted to continue to work on coffee. Asked NT to do this when pt finishes coffee.  NT to check on pt.  Size of sips was appropriate.      OT Diagnosis:    OT Problem List:   OT Treatment Interventions:     OT Goals(current goals can now be found in the care plan section)    Visit Information  Last OT Received On: 04/28/13 Assistance Needed: +1 History of Present Illness: Pt with acute R MCA CVA with L sided weakness (UE/grip > LLE) and some L sided inattention.  Was indpenedent PTA without use of AD.  Lives in apartment with son/daughter in law close by.  Unsure if they are able to provide 24/7 assist at this  time.      Subjective Data      Prior Functioning       Cognition  Cognition Arousal/Alertness: Awake/alert Behavior During Therapy:  (teaful) Area of Impairment: Attention Current Attention Level: Sustained    Mobility  Bed Mobility Bed Mobility: Supine to Sit Supine to Sit: 4: Min assist Details for Bed Mobility Assistance: cues for technique Transfers Sit to Stand: 4: Min assist;From bed;With upper extremity assist Details for Transfer Assistance: assist to rise and steady    Exercises      Balance Static Sitting Balance Static Sitting - Balance Support: No upper extremity supported Static Sitting - Level of Assistance: 5: Stand by assistance   End of Session OT - End of Session Activity Tolerance: Patient tolerated treatment well Patient left: in chair;with chair alarm set;with call bell/phone within reach  GO     Tico Crotteau 04/28/2013, 10:07 AM Marica Otter, OTR/L [phone] 04/28/2013

## 2013-04-28 NOTE — Progress Notes (Signed)
Patient was accepted to [facility]. CSW signing off.   Clinical Social Work Department CLINICAL SOCIAL WORK PLACEMENT NOTE 04/28/2013  Patient:  Ryan Brady, Ryan Brady  Account Number:  [id number] Admit date:  04/24/2013  Clinical Social Worker:  Unk Lightning, LCSW  Date/time:  04/27/2013 01:45 PM  Clinical Social Work is seeking post-discharge placement for this patient at the following level of care:   SKILLED NURSING   (*CSW will update this form in Epic as items are completed)   04/27/2013  Patient/family provided with [facility] Department of Clinical Social Work's list of facilities offering this level of care within the geographic area requested by the patient (or if unable, by the patient's family).  04/27/2013  Patient/family informed of their freedom to choose among providers that offer the needed level of care, that participate in Medicare, Medicaid or managed care program needed by the patient, have an available bed and are willing to accept the patient.  04/27/2013  Patient/family informed of [facility]' ownership interest in [facility], as well as of the fact that they are under no obligation to receive care at this facility.  PASARR submitted to EDS on 04/27/2013 PASARR number received from EDS on 04/27/2013  FL2 transmitted to all facilities in geographic area requested by pt/family on  04/27/2013 FL2 transmitted to all facilities within larger geographic area on   Patient informed that his/her managed care company has contracts with or will negotiate with  certain facilities, including the following:     Patient/family informed of bed offers received:   Patient chooses bed at  Physician recommends and patient chooses bed at    Patient to be transferred to [facility] on  04/28/2013 Patient to be transferred to facility by [facility]  The following physician request were entered in Epic:   Additional Comments: accepted & discharged to [facility].   Unice Bailey, LCSW [facility] Clinical Social Worker cell #: [phone]

## 2013-04-29 ENCOUNTER — Inpatient Hospital Stay (HOSPITAL_COMMUNITY): Payer: Medicare Other

## 2013-04-29 ENCOUNTER — Inpatient Hospital Stay (HOSPITAL_COMMUNITY): Payer: Medicare Other | Admitting: Physical Therapy

## 2013-04-29 ENCOUNTER — Inpatient Hospital Stay (HOSPITAL_COMMUNITY): Payer: Medicare Other | Admitting: Speech Pathology

## 2013-04-29 DIAGNOSIS — I69993 Ataxia following unspecified cerebrovascular disease: Secondary | ICD-10-CM

## 2013-04-29 DIAGNOSIS — I6330 Cerebral infarction due to thrombosis of unspecified cerebral artery: Secondary | ICD-10-CM

## 2013-04-29 LAB — CBC WITH DIFFERENTIAL/PLATELET
Basophils Absolute: 0.1 10*3/uL (ref 0.0–0.1)
Basophils Relative: 1 % (ref 0–1)
Eosinophils Relative: 3 % (ref 0–5)
HCT: 36.7 % — ABNORMAL LOW (ref 39.0–52.0)
MCHC: 33.8 g/dL (ref 30.0–36.0)
MCV: 86.8 fL (ref 78.0–100.0)
Monocytes Absolute: 1.7 10*3/uL — ABNORMAL HIGH (ref 0.1–1.0)
Neutro Abs: 6 10*3/uL (ref 1.7–7.7)
Platelets: 282 10*3/uL (ref 150–400)
RDW: 18.9 % — ABNORMAL HIGH (ref 11.5–15.5)
WBC: 10.9 10*3/uL — ABNORMAL HIGH (ref 4.0–10.5)

## 2013-04-29 LAB — COMPREHENSIVE METABOLIC PANEL
ALT: 26 U/L (ref 0–53)
AST: 24 U/L (ref 0–37)
Albumin: 3 g/dL — ABNORMAL LOW (ref 3.5–5.2)
Calcium: 8.8 mg/dL (ref 8.4–10.5)
Creatinine, Ser: 1.19 mg/dL (ref 0.50–1.35)
Sodium: 136 mEq/L (ref 135–145)

## 2013-04-29 LAB — GLUCOSE, CAPILLARY
Glucose-Capillary: 99 mg/dL (ref 70–99)
Glucose-Capillary: 127 mg/dL — ABNORMAL HIGH (ref 70–99)

## 2013-04-29 MED ORDER — CHLORPROMAZINE HCL 25 MG PO TABS
25.0000 mg | ORAL_TABLET | Freq: Three times a day (TID) | ORAL | Status: DC | PRN
  Administered 2013-04-29 – 2013-05-06 (×10): 25 mg via ORAL
  Filled 2013-04-29 (×13): qty 1

## 2013-04-29 MED ORDER — POTASSIUM CHLORIDE CRYS ER 10 MEQ PO TBCR
10.0000 meq | EXTENDED_RELEASE_TABLET | Freq: Two times a day (BID) | ORAL | Status: AC
  Administered 2013-04-29 – 2013-04-30 (×4): 10 meq via ORAL
  Filled 2013-04-29 (×5): qty 1

## 2013-04-29 MED ORDER — METHYLPHENIDATE HCL 5 MG PO TABS
5.0000 mg | ORAL_TABLET | Freq: Two times a day (BID) | ORAL | Status: DC
  Administered 2013-04-29 – 2013-05-08 (×18): 5 mg via ORAL
  Filled 2013-04-29 (×18): qty 1

## 2013-04-29 NOTE — Evaluation (Signed)
Speech Language Pathology Assessment and Plan  Patient Details  Name: Ryan Brady MRN: [id number] Date of Birth: [birth year]  SLP Diagnosis: Cognitive Impairments;Dysphagia;Dysarthria  Rehab Potential: Good ELOS: 7-10 days   Today's Date: 04/29/2013 Time: 1330-1430 Time Calculation (min): 60 min  Skilled Therapeutic Intervention: Administered BSE and cognitive-linguistic evaluation. Please see below for details.   Problem List:  Patient Active Problem List   Diagnosis Date Noted  . 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 Hypokalemia 04/26/2013  . CVA (cerebral infarction) 04/24/2013  . Accelerated hypertension 04/24/2013  . Leukocytosis 04/24/2013  . Depression 04/24/2013   Past Medical History:  Past Medical History  Diagnosis Date  . Stroke   . Diabetes mellitus without complication   . Hypertension    Past Surgical History:  Past Surgical History  Procedure Laterality Date  . Tonsillectomy      Assessment / Plan / Recommendation Clinical Impression  Pt is a 67 y.o. right-handed male with history of hypertension, diabetes mellitus and CVA. Admitted 04/24/2013 with difficulty speaking. Patient independent prior to admission without the use of assistive device. MRI of the brain shows acute right MCA territory infarct as well as bilateral chronic lacunar infarcts affecting the right and left basal ganglia. MRA of the head was subtotal occlusion left A1 ACA. Echocardiogram with ejection fraction of 70% grade 1 diastolic dysfunction without emboli. Carotid Dopplers with 0-39% right ICA stenosis. Left ICA flow not visualized past the very proximal portion, question occlusion. Patient did not receive TPA. Placed on Plavix therapy for CVA prophylaxis as well as subcutaneous Lovenox for DVT prophylaxis. Patient had been on aspirin prior to admission. Currently maintained on a dysphagia 2 textures with thin liquids. Physical and occupational therapy evaluations completed with  recommendations of physical medicine rehabilitation consult to consider inpatient rehabilitation services. Patient was felt to be a good candidate for inpatient rehabilitation services and was admitted for comprehensive rehabilitation program on 04/28/13. Pt presents with moderate cognitive linguistic deficits characterized by decreased sustained attention, functional problem solving, intellectual awareness, safety awareness, working memory and left inattention impacting pt's ability to perform functional ADL's safely.  Pt also presents with moderate dysarthria characterized by low vocal intensity and imprecise consonants impacting pt's overall speech intelligibility. Pt also demonstrates mild word finding deficits and occasional disfluency. Pt's family was not present to confirm pt's baseline level of functioning. Pt administered BSE and presents with mild oral dysphagia characterized by delayed AP transit and oral residue. Recommend Dys. 3 textures with thin liquids. Pt would benefit from skilled SLP intervention to maximize cognitive recovery, speech intelligibility, functional communication and swallowing function with least restrictive diet. Anticipate pt will need 24 hour supervision and follow up home health services.      SLP Assessment  Patient will need skilled Speech Lanaguage Pathology Services during CIR admission    Recommendations  Diet Recommendations: Thin liquid;Dysphagia 3 (Mechanical Soft) Liquid Administration via: Cup;Straw Medication Administration: Whole meds with puree Supervision: Full supervision/cueing for compensatory strategies;Patient able to self feed Compensations: Slow rate;Small sips/bites;Check for pocketing Postural Changes and/or Swallow Maneuvers: Seated upright 90 degrees;Upright 30-60 min after meal Oral Care Recommendations: Oral care BID Patient destination: Home Follow up Recommendations: Home Health SLP;24 hour supervision/assistance Equipment Recommended:  None recommended by SLP    SLP Frequency 5 out of 7 days   SLP Treatment/Interventions Cognitive remediation/compensation;Cueing hierarchy;Dysphagia/aspiration precaution training;Functional tasks;Internal/external aids;Patient/family education;Therapeutic Activities;Speech/Language facilitation;Environmental controls    Pain No/Denies Pain Prior Functioning Type of Home: Apartment  Lives With: Alone Available Help  at Discharge: Family;Available PRN/intermittently Vocation: Retired Physiological scientist)   See FIM for current functional status Refer to Care Plan for Long Term Goals  Recommendations for other services: Neuropsych  Discharge Criteria: Patient will be discharged from SLP if patient refuses treatment 3 consecutive times without medical reason, if treatment goals not met, if there is a change in medical status, if patient makes no progress towards goals or if patient is discharged from hospital.  The above assessment, treatment plan, treatment alternatives and goals were discussed and mutually agreed upon: by patient  Maryanne Huneycutt 04/29/2013, 3:04 PM

## 2013-04-29 NOTE — Progress Notes (Signed)
Occupational Therapy Assessment and Plan  Patient Details  Name: Ryan Brady MRN: [id number] Date of Birth: 19-Nov-1945  OT Diagnosis: cognitive deficits, disturbance of vision and muscle weakness (generalized) Rehab Potential: Rehab Potential: Good ELOS: 10-12 days   Today's Date: 04/29/2013 Time: 0930-1030 Time Calculation (min): 60 min  Problem List:  Patient Active Problem List   Diagnosis Date Noted  . 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 Hypokalemia 04/26/2013  . Type II or unspecified type diabetes mellitus with neurological manifestations, not stated as uncontrolled(250.60) 04/25/2013  . CVA (cerebral infarction) 04/24/2013  . Accelerated hypertension 04/24/2013  . Leukocytosis 04/24/2013  . Depression 04/24/2013    Past Medical History:  Past Medical History  Diagnosis Date  . Stroke   . Diabetes mellitus without complication   . Hypertension    Past Surgical History:  Past Surgical History  Procedure Laterality Date  . Tonsillectomy      Assessment & Plan Clinical Impression: Patient is a 67 y.o. year old male with recent admission to the hospital on 04/24/13 with difficulty speaking. Patient independent prior to admission without the use of assistive device. MRI of the brain shows acute right MCA territory infarct as well as bilateral chronic lacunar infarcts affecting the right and left basal ganglia. MRA of the head was subtotal occlusion left A1 ACA. Echocardiogram with ejection fraction of 70% grade 1 diastolic dysfunction without emboli. Carotid Dopplers with 0-39% right ICA stenosis. Left ICA flow not visualized past the very proximal portion, question occlusion. Patient did not receive TPA. Placed on Plavix therapy for CVA prophylaxis as well as subcutaneous Lovenox for DVT prophylaxis. Patient had been on aspirin prior to admission. Currently maintained on a dysphagia 2 thin liquid diet. Patient transferred to CIR on 04/28/2013 .    Patient currently  requires min -mod with basic self-care skills secondary to decreased visual acuity, decreased visual perceptual skills and hemianopsia, decreased attention to left and decreased initiation, decreased attention, decreased awareness, decreased problem solving, decreased safety awareness, decreased memory and delayed processing.  Prior to hospitalization, patient could complete BADLS at independent .  Patient will benefit from skilled intervention to increase independence with basic self-care skills and increase level of independence with iADL prior to discharge home with supervision .  Anticipate patient will require 24 hour supervision and intermittent supervision and follow up home health.  OT - End of Session Activity Tolerance: Decreased this session OT Assessment Rehab Potential: Good Barriers to Discharge: Decreased caregiver support OT Plan OT Intensity: Minimum of 1-2 x/day, 45 to 90 minutes OT Frequency: 5 out of 7 days OT Duration/Estimated Length of Stay: 10-12 days OT Treatment/Interventions: Balance/vestibular training;Cognitive remediation/compensation;Community reintegration;Discharge planning;Functional mobility training;Psychosocial support;Therapeutic Activities;UE/LE Coordination activities;Visual/perceptual remediation/compensation;Patient/family education;DME/adaptive equipment instruction;Disease mangement/prevention;Neuromuscular re-education;Self Care/advanced ADL retraining;Therapeutic Exercise;UE/LE Strength taining/ROM OT Recommendation Recommendations for Other Services: Speech consult Patient destination: Home Follow Up Recommendations: Home health OT;24 hour supervision/assistance Equipment Recommended: Tub/shower bench   Skilled Therapeutic Intervention Therapy session focused on postural control, safety awareness, initiation, attention to L, sitting balance, and problem solving during ADL retraining. Pt with decreased attention to L side during dressing task and  grooming task. Pt declined bathing on this date. Pt with decreased safety and awareness during LB dressing as he wanted to stand while standing on bottom of pant leg and wanted to stand with slick socks on. Pt had difficulty with forward flexion to thread BLE into clothing and required increased time. Pt stood with min assist and attempted to fasten pants. Pants were  very tight however pt consistently refused to change into larger pants so OT assisted with fastening pants with increased time. Good carryover noted with use of shoe horn. Ambulated from bed to toilet with min assist hand held and no lob. Completed stand pivot transfers with min assist. Pt demonstrated poor postural control when sitting unsupported EOB during self-care tasks as he consistently demonstrated posterior lean. Pt required increased time for processing and cues for initiation during self-care tasks. Pt with decreased attention to tasks requiring cues to re-direct as he was easily distracted.   OT Evaluation Precautions/Restrictions  Precautions Precautions: Fall Precaution Comments: L sided weakness (UE >LE) and some mild L sided inattention.  Restrictions Weight Bearing Restrictions: No General   Vital Signs   Pain   Home Living/Prior Functioning Home Living Available Help at Discharge: Family;Available PRN/intermittently Type of Home: Apartment Home Access: Level entry Home Layout: One level  Lives With: Alone Prior Function Level of Independence: Independent with basic ADLs;Independent with homemaking with ambulation  Able to Take Stairs?: Yes Driving: Yes Vocation: Retired Physiological scientist) Leisure: Hobbies-yes (Comment) Comments: pt walks 3 miles every day.   ADL   Vision/Perception  Vision - History Baseline Vision: Bifocals Visual History: Glaucoma Patient Visual Report: Unable to keep objects in focus Vision - Assessment Eye Alignment: Within Functional Limits Vision Assessment: Vision tested Ocular  Range of Motion: Within Functional Limits Tracking/Visual Pursuits: Other (comment) (decresaed tracking to L visual field ) Visual Fields: Left homonymous hemianopsia  Cognition Overall Cognitive Status: No family/caregiver present to determine baseline cognitive functioning Orientation Level: Oriented to place;Oriented to situation Attention: Sustained Sustained Attention: Impaired Sustained Attention Impairment: Functional basic Memory: Impaired Memory Impairment: Retrieval deficit;Decreased recall of new information;Decreased short term memory;Prospective memory;Storage deficit Awareness: Impaired Problem Solving: Impaired Safety/Judgment: Impaired Sensation Sensation Light Touch: Appears Intact Coordination Gross Motor Movements are Fluid and Coordinated: No Fine Motor Movements are Fluid and Coordinated: No Finger Nose Finger Test: increased time  Motor    Mobility     Trunk/Postural Assessment     Balance   Extremity/Trunk Assessment RUE Assessment RUE Assessment: Within Functional Limits LUE Assessment LUE Assessment: Within Functional Limits  FIM:  FIM - Eating Eating Activity: 5: Supervision/cues;5: Set-up assist for open containers;5: Set-up assist for cut food FIM - Grooming Grooming Steps: Oral care, brush teeth, clean dentures Grooming: 5: Set-up assist to open containers (completed in standing with steadying assist) FIM - Bathing Bathing: 0: Activity did not occur FIM - Upper Body Dressing/Undressing Upper body dressing/undressing steps patient completed: Thread/unthread right sleeve of pullover shirt/dresss Upper body dressing/undressing: 4: Min-Patient completed 75 plus % of tasks FIM - Lower Body Dressing/Undressing Lower body dressing/undressing steps patient completed: Thread/unthread left underwear leg;Pull underwear up/down;Thread/unthread right pants leg;Thread/unthread left pants leg;Pull pants up/down;Don/Doff left shoe;Don/Doff right shoe Lower  body dressing/undressing: 3: Mod-Patient completed 50-74% of tasks FIM - Bed/Chair Transfer Bed/Chair Transfer: 4: Chair or W/C > Bed: Min A (steadying Pt. > 75%);4: Bed > Chair or W/C: Min A (steadying Pt. > 75%) FIM - Diplomatic Services operational officer Devices: Grab bars Toilet Transfers: 4-To toilet/BSC: Min A (steadying Pt. > 75%);4-From toilet/BSC: Min A (steadying Pt. > 75%)   Refer to Care Plan for Long Term Goals  Recommendations for other services: None  Discharge Criteria: Patient will be discharged from OT if patient refuses treatment 3 consecutive times without medical reason, if treatment goals not met, if there is a change in medical status, if patient makes no progress towards  goals or if patient is discharged from hospital.  The above assessment, treatment plan, treatment alternatives and goals were discussed and mutually agreed upon: by patient  Daneil Dan 04/29/2013, 11:42 AM

## 2013-04-29 NOTE — Plan of Care (Signed)
Problem: RH BLADDER ELIMINATION Goal: RH STG MANAGE BLADDER WITH ASSISTANCE STG Manage Bladder With mod Assistance  Outcome: Not Progressing Condom cath at [facility] and briefs changed by staff during the day

## 2013-04-29 NOTE — Evaluation (Signed)
Physical Therapy Assessment and Plan  Patient Details  Name: Ryan Brady MRN: [id number] Date of Birth: [birth year]  PT Diagnosis: Abnormality of gait, Cognitive deficits, Difficulty walking, Impaired cognition and Muscle weakness Rehab Potential: Good ELOS: 10 days   Today's Date: 04/29/2013 Time: Session #1:  [id number] Session #2: [id number] Time Calculation (min): Session #1: 86 min, Session #2: 31 min   Problem List:  Patient Active Problem List   Diagnosis Date Noted  . 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 Hypokalemia 04/26/2013  . Type II or unspecified type diabetes mellitus with neurological manifestations, not stated as uncontrolled(250.60) 04/25/2013  . CVA (cerebral infarction) 04/24/2013  . Accelerated hypertension 04/24/2013  . Leukocytosis 04/24/2013  . Depression 04/24/2013    Past Medical History:  Past Medical History  Diagnosis Date  . Stroke   . Diabetes mellitus without complication   . Hypertension    Past Surgical History:  Past Surgical History  Procedure Laterality Date  . Tonsillectomy      Assessment & Plan Clinical Impression: Ryan Brady is a 67 y.o. right-handed male with history of hypertension, diabetes mellitus and CVA. Admitted 04/24/2013 with difficulty speaking. Patient independent prior to admission without the use of assistive device. MRI of the brain shows acute right MCA territory infarct as well as bilateral chronic lacunar infarcts affecting the right and left basal ganglia. MRA of the head was subtotal occlusion left A1 ACA. Echocardiogram with ejection fraction of 70% grade 1 diastolic dysfunction without emboli. Carotid Dopplers with 0-39% right ICA stenosis. Left ICA flow not visualized past the very proximal portion, question occlusion. Patient did not receive TPA. Placed on Plavix therapy for CVA prophylaxis as well as subcutaneous Lovenox for DVT prophylaxis. Patient had been on aspirin prior to admission. Currently  maintained on a dysphagia 2 thin liquid diet.  Patient transferred to CIR on 04/28/2013 .   Patient currently requires mod with mobility secondary to muscle weakness, decreased coordination and decreased motor planning, decreased motor planning and decreased awareness, decreased problem solving, decreased safety awareness and decreased memory.  Prior to hospitalization, patient was independent  with mobility and lived with Alone in a Apartment home.  Home access is  Level entry.  Patient will benefit from skilled PT intervention to maximize safe functional mobility and minimize fall risk for planned discharge home with 24 hour supervision.  Anticipate patient will benefit from follow up OP at discharge.  PT - End of Session Activity Tolerance: Tolerates 30+ min activity without fatigue Endurance Deficit: No PT Assessment Rehab Potential: Good Barriers to Discharge: Decreased caregiver support PT Plan PT Intensity: Minimum of 1-2 x/day ,45 to 90 minutes PT Frequency: 5 out of 7 days PT Duration Estimated Length of Stay: 10 days PT Treatment/Interventions: Ambulation/gait training;Balance/vestibular training;Cognitive remediation/compensation;Community reintegration;Discharge planning;DME/adaptive equipment instruction;Functional mobility training;Neuromuscular re-education;Patient/family education;Psychosocial support;Stair training;Therapeutic Exercise;Therapeutic Activities;UE/LE Strength taining/ROM;UE/LE Coordination activities;Visual/perceptual remediation/compensation PT Recommendation Follow Up Recommendations: Outpatient PT;24 hour supervision/assistance Patient destination: Home Equipment Recommended: None recommended by PT Equipment Details: at this time-may consider cane vs RW, but pt does not want either at this time  Skilled Therapeutic Intervention Session #1: This session focused on bed mobility sitting and standing balance, transfers and gait in the room.  Min assist overall with  decreased awareness of deficits.  Decreased safety awareness and decreased balance reactions with LOB.   Session #2: This session focused on gait with min hand held assist >200' with increased LOB with head turns and 90 or 180 degree turns. Stairs with bil  rails min assist reciprocal pattern despite cues to do step to pattern.  Pt having difficulty with foot placement bil (depth perception related to vision ? or coordination).    PT Evaluation Precautions/Restrictions Precautions Precautions: Fall Precaution Comments: L sided weakness (UE >LE) and some mild L sided inattention.  Restrictions Weight Bearing Restrictions: No General   Vital Signs    Home Living/Prior Functioning Home Living Available Help at Discharge: Family;Available PRN/intermittently Type of Home: Apartment Home Access: Level entry Home Layout: One level  Lives With: Alone Prior Function Level of Independence: Independent with basic ADLs;Independent with homemaking with ambulation  Able to Take Stairs?: Yes Driving: Yes Vocation: Retired Physiological scientist) Leisure: Hobbies-yes (Comment) Comments: pt walks 3 miles every day.   Vision/Perception  Vision - History Baseline Vision: Bifocals Visual History: Glaucoma Patient Visual Report: Unable to keep objects in focus Vision - Assessment Eye Alignment: Within Functional Limits Vision Assessment: Vision tested Ocular Range of Motion: Within Functional Limits Tracking/Visual Pursuits: Other (comment) (decresaed tracking to L visual field ) Visual Fields: Left homonymous hemianopsia  Cognition Overall Cognitive Status: No family/caregiver present to determine baseline cognitive functioning Orientation Level: Oriented to place;Oriented to situation Attention: Sustained Sustained Attention: Impaired Sustained Attention Impairment: Functional basic Memory: Impaired Memory Impairment: Retrieval deficit;Decreased recall of new information;Decreased short term  memory;Prospective memory;Storage deficit Awareness: Impaired Problem Solving: Impaired Safety/Judgment: Impaired Sensation Sensation Light Touch: Appears Intact Additional Comments: in bil legs, despite being diabetic no reports of peripherial neuropathy Coordination Gross Motor Movements are Fluid and Coordinated: No Fine Motor Movements are Fluid and Coordinated: No Finger Nose Finger Test: increased time  Heel Shin Test: pt with decreased coordination of this bil vs decreased ability to process what I am asking of him.  He had the processing issue with my manual muscle testing as well.       Mobility Bed Mobility Bed Mobility: Supine to Sit;Sitting - Scoot to Edge of Bed Supine to Sit: 4: Min assist Sitting - Scoot to [place] of Bed: 4: Min assist Sitting - Scoot to [place] of Bed Details (indicate cue type and reason): min assist to support trunk to prevent LOB.  Pt did preform with HOB flat and no rails.   Transfers Sit to Stand: 4: Min assist;With upper extremity assist;From bed;From chair/3-in-1;With armrests;From toilet Stand to Sit: 4: Min assist;Without upper extremity assist;With armrests;To bed;To chair/3-in-1;To toilet Stand Pivot Transfers: 4: Min assist;With armrests Stand Pivot Transfer Details (indicate cue type and reason): min assist to support trunk for balance during transfers.  Standing he intially tends to lean posteriorly.   Locomotion  Ambulation Ambulation: Yes Ambulation/Gait Assistance: 4: Min assist Ambulation Distance (Feet): 25 Feet (around his room) Assistive device: 1 person hand held assist Ambulation/Gait Assistance Details: verbal cues for awareness of balance deficits, to slow gait speed.    Trunk/Postural Assessment     Balance Static Sitting Balance Static Sitting - Balance Support: No upper extremity supported;Feet unsupported Static Sitting - Level of Assistance: 3: Mod assist Static Standing Balance Static Standing - Balance Support: Right  upper extremity supported;Left upper extremity supported;During functional activity (one upper extremity supported) Static Standing - Level of Assistance: 4: Min assist Extremity Assessment  RUE Assessment RUE Assessment: Within Functional Limits LUE Assessment LUE Assessment: Within Functional Limits RLE Assessment RLE Assessment: Exceptions to [facility] RLE Strength RLE Overall Strength Comments: difficult to assess due to processing abilities but grossly 3+-4/5 per functional assessment.  No obvious asymmetires noted, but difficult to tell because he could  not process my MMT cues in sitting.   LLE Assessment LLE Assessment: Exceptions to [facility] LLE Strength LLE Overall Strength Comments: difficult to assess due to processing abilities but grossly 3+-4/5 per functional assessment.  No obvious asymmetires noted, but difficult to tell because he could not process my MMT cues in sitting.  I would anticipate due to the location of his stroke that this side might be weaker.    FIM:  FIM - Banker Devices: Arm rests Bed/Chair Transfer: 4: Supine > Sit: Min A (steadying Pt. > 75%/lift 1 leg);4: Bed > Chair or W/C: Min A (steadying Pt. > 75%);4: Chair or W/C > Bed: Min A (steadying Pt. > 75%) FIM - Locomotion: Wheelchair Locomotion: Wheelchair: 0: Activity did not occur FIM - Locomotion: Ambulation Locomotion: Ambulation Assistive Devices: Other (comment) (one person hand held assist) Ambulation/Gait Assistance: 4: Min assist Locomotion: Ambulation: 1: Travels less than 50 ft with minimal assistance (Pt.>75%) FIM - Locomotion: Stairs Locomotion: Stairs: 0: Activity did not occur   Refer to Care Plan for Long Term Goals  Recommendations for other services: None  Discharge Criteria: Patient will be discharged from PT if patient refuses treatment 3 consecutive times without medical reason, if treatment goals not met, if there is a change in medical status, if  patient makes no progress towards goals or if patient is discharged from hospital.  The above assessment, treatment plan, treatment alternatives and goals were discussed and mutually agreed upon: by patient  Lurena Joiner B. Raelin Pixler, PT, DPT [phone]   04/29/2013, 12:31 PM

## 2013-04-29 NOTE — Progress Notes (Signed)
Patient ID: Ryan Brady, male   DOB: 1946-06-02, 67 y.o.   MRN: [id number] Subjective/Complaints: 67 y.o. right-handed male with history of hypertension, diabetes mellitus and CVA. Admitted 04/24/2013 with difficulty speaking. Patient independent prior to admission without the use of assistive device. MRI of the brain shows acute right MCA territory infarct as well as bilateral chronic lacunar infarcts affecting the right and left basal ganglia. MRA of the head was subtotal occlusion left A1 ACA. Echocardiogram with ejection fraction of 70% grade 1 diastolic dysfunction without emboli. Carotid Dopplers with 0-39% right ICA stenosis. Left ICA flow not visualized past the very proximal portion, question occlusion. Patient did not receive TPA  Review of Systems  Unable to perform ROS: mental acuity   Slept ok very delayed responses  Objective: Vital Signs: Blood pressure 133/89, pulse 85, temperature 99.4 F (37.4 C), temperature source Oral, resp. rate 19, height 5\' 9"  (1.753 m), weight 77.3 kg (170 lb 6.7 oz), SpO2 98.00%. No results found. Results for orders placed during the hospital encounter of 04/28/13 (from the past 72 hour(s))  GLUCOSE, CAPILLARY     Status: Abnormal   Collection Time    04/28/13  4:51 PM      Result Value Range   Glucose-Capillary 127 (*) 70 - 99 mg/dL  CBC     Status: Abnormal   Collection Time    04/28/13  5:21 PM      Result Value Range   WBC 12.7 (*) 4.0 - 10.5 K/uL   RBC 4.25  4.22 - 5.81 MIL/uL   Hemoglobin 12.5 (*) 13.0 - 17.0 g/dL   HCT 40.9 (*) 81.1 - 91.4 %   MCV 86.6  78.0 - 100.0 fL   MCH 29.4  26.0 - 34.0 pg   MCHC 34.0  30.0 - 36.0 g/dL   RDW 78.2 (*) 95.6 - 21.3 %   Platelets 267  150 - 400 K/uL  CREATININE, SERUM     Status: Abnormal   Collection Time    04/28/13  5:21 PM      Result Value Range   Creatinine, Ser 1.17  0.50 - 1.35 mg/dL   GFR calc non Af Amer 62 (*) >90 mL/min   GFR calc Af Amer 72 (*) >90 mL/min   Comment:          The eGFR has been calculated     using the CKD EPI equation.     This calculation has not been     validated in all clinical     situations.     eGFR's persistently     <90 mL/min signify     possible Chronic Kidney Disease.  GLUCOSE, CAPILLARY     Status: None   Collection Time    04/28/13  9:13 PM      Result Value Range   Glucose-Capillary 86  70 - 99 mg/dL   Comment 1 Notify RN    CBC WITH DIFFERENTIAL     Status: Abnormal   Collection Time    04/29/13  6:00 AM      Result Value Range   WBC 10.9 (*) 4.0 - 10.5 K/uL   RBC 4.23  4.22 - 5.81 MIL/uL   Hemoglobin 12.4 (*) 13.0 - 17.0 g/dL   HCT 08.6 (*) 57.8 - 46.9 %   MCV 86.8  78.0 - 100.0 fL   MCH 29.3  26.0 - 34.0 pg   MCHC 33.8  30.0 - 36.0 g/dL   RDW 62.9 (*) 52.8 -  15.5 %   Platelets 282  150 - 400 K/uL   Neutrophils Relative % 56  43 - 77 %   Neutro Abs 6.0  1.7 - 7.7 K/uL   Lymphocytes Relative 25  12 - 46 %   Lymphs Abs 2.7  0.7 - 4.0 K/uL   Monocytes Relative 16 (*) 3 - 12 %   Monocytes Absolute 1.7 (*) 0.1 - 1.0 K/uL   Eosinophils Relative 3  0 - 5 %   Eosinophils Absolute 0.3  0.0 - 0.7 K/uL   Basophils Relative 1  0 - 1 %   Basophils Absolute 0.1  0.0 - 0.1 K/uL  COMPREHENSIVE METABOLIC PANEL     Status: Abnormal   Collection Time    04/29/13  6:00 AM      Result Value Range   Sodium 136  135 - 145 mEq/L   Potassium 3.4 (*) 3.5 - 5.1 mEq/L   Chloride 103  96 - 112 mEq/L   CO2 23  19 - 32 mEq/L   Glucose, Bld 93  70 - 99 mg/dL   BUN 15  6 - 23 mg/dL   Creatinine, Ser 4.09  0.50 - 1.35 mg/dL   Calcium 8.8  8.4 - 81.1 mg/dL   Total Protein 6.4  6.0 - 8.3 g/dL   Albumin 3.0 (*) 3.5 - 5.2 g/dL   AST 24  0 - 37 U/L   ALT 26  0 - 53 U/L   Alkaline Phosphatase 121 (*) 39 - 117 U/L   Total Bilirubin 0.3  0.3 - 1.2 mg/dL   GFR calc non Af Amer 61 (*) >90 mL/min   GFR calc Af Amer 70 (*) >90 mL/min   Comment:            The eGFR has been calculated     using the CKD EPI equation.     This calculation has  not been     validated in all clinical     situations.     eGFR's persistently     <90 mL/min signify     possible Chronic Kidney Disease.  GLUCOSE, CAPILLARY     Status: None   Collection Time    04/29/13  7:44 AM      Result Value Range   Glucose-Capillary 99  70 - 99 mg/dL   Comment 1 Notify RN    GLUCOSE, CAPILLARY     Status: Abnormal   Collection Time    04/29/13 11:34 AM      Result Value Range   Glucose-Capillary 127 (*) 70 - 99 mg/dL   Comment 1 Notify RN       Eyes: EOM are normal.  Neck: Normal range of motion. Neck supple. No thyromegaly present.  Cardiovascular: Normal rate and regular rhythm.  Pulmonary/Chest: Effort normal and breath sounds normal. No respiratory distress.  Abdominal: Soft. Bowel sounds are normal. He exhibits no distension.  Neurological: He is alert.  Patient is aphasic and and limited the following simple commands. Poor awareness of deficits and decreased attention  Skin: Skin is warm and dry.  visual fields are intact the confrontational testing, left neglect noted with double simultaneous stimulation  Neuro:  Eyes without evidence of nystagmus  Tone is normal without evidence of spasticity  Cerebellar exam shows no evidence of ataxia on finger nose finger or heel to shin testing  No evidence of trunkal ataxia  Motor strength is 5/5 in bilateral deltoid, biceps, triceps, finger flexors and extensors,  wrist flexors and extensors, hip flexors, knee flexors and extensors, ankle dorsiflexors, plantar flexors, invertors and evertors, toe flexors and extensors     Assessment/Plan: 1. Functional deficits secondary to acute right thrombotic MCA infarct superimposed on bilateral basal ganglia chronic infarcts which require 3+ hours per day of interdisciplinary therapy in a comprehensive inpatient rehab setting. Physiatrist is providing close team supervision and 24 hour management of active medical problems listed below. Physiatrist and rehab team  continue to assess barriers to discharge/monitor patient progress toward functional and medical goals. FIM: FIM - Bathing Bathing: 0: Activity did not occur  FIM - Upper Body Dressing/Undressing Upper body dressing/undressing steps patient completed: Thread/unthread right sleeve of pullover shirt/dresss Upper body dressing/undressing: 4: Min-Patient completed 75 plus % of tasks FIM - Lower Body Dressing/Undressing Lower body dressing/undressing steps patient completed: Thread/unthread left underwear leg;Pull underwear up/down;Thread/unthread right pants leg;Thread/unthread left pants leg;Pull pants up/down;Don/Doff left shoe;Don/Doff right shoe Lower body dressing/undressing: 3: Mod-Patient completed 50-74% of tasks     FIM - Diplomatic Services operational officer Devices: Grab bars Toilet Transfers: 4-To toilet/BSC: Min A (steadying Pt. > 75%);4-From toilet/BSC: Min A (steadying Pt. > 75%)  FIM - Bed/Chair Transfer Bed/Chair Transfer Assistive Devices: Arm rests Bed/Chair Transfer: 4: Supine > Sit: Min A (steadying Pt. > 75%/lift 1 leg);4: Bed > Chair or W/C: Min A (steadying Pt. > 75%);4: Chair or W/C > Bed: Min A (steadying Pt. > 75%)  FIM - Locomotion: Wheelchair Locomotion: Wheelchair: 0: Activity did not occur FIM - Locomotion: Ambulation Locomotion: Ambulation Assistive Devices: Other (comment) (one person hand held assist) Ambulation/Gait Assistance: 4: Min assist Locomotion: Ambulation: 1: Travels less than 50 ft with minimal assistance (Pt.>75%)  Comprehension Comprehension Mode: Auditory Comprehension: 4-Understands basic 75 - 89% of the time/requires cueing 10 - 24% of the time  Expression Expression Mode: Verbal Expression: 3-Expresses basic 50 - 74% of the time/requires cueing 25 - 50% of the time. Needs to repeat parts of sentences.  Social Interaction Social Interaction: 5-Interacts appropriately 90% of the time - Needs monitoring or encouragement for  participation or interaction.  Problem Solving Problem Solving: 3-Solves basic 50 - 74% of the time/requires cueing 25 - 49% of the time  Memory Memory: 3-Recognizes or recalls 50 - 74% of the time/requires cueing 25 - 49% of the time  Medical Problem List and Plan:  1. Thrombotic right MCA infarct  2. DVT Prophylaxis/Anticoagulation: Subcutaneous Lovenox. Monitor platelet counts any signs of bleeding  3. Pain Management: Tylenol as needed  4. Dysphagia. Dysphagia to thin liquid diet. Monitor for any signs of aspiration and followup speech therapy  5. Mood/depression. Paxil 20 mg daily. Provide emotional support  6. Neuropsych: This patient is not capable of making decisions on his own behalf.  7. Hypertension. Lisinopril 40 mg daily. Monitor with increased activity  8. ?Diabetes mellitus. Question diagnosis Hemoglobin A1c 5.6. Patient is diet controlled.  blood sugars a.c. and at bedtime are normal will D/C   LOS (Days) 1 A FACE TO FACE EVALUATION WAS PERFORMED  KIRSTEINS,ANDREW E 04/29/2013, 1:10 PM

## 2013-04-30 ENCOUNTER — Inpatient Hospital Stay (HOSPITAL_COMMUNITY): Payer: Medicare Other | Admitting: Speech Pathology

## 2013-04-30 ENCOUNTER — Inpatient Hospital Stay (HOSPITAL_COMMUNITY): Payer: Medicare Other

## 2013-04-30 MED ORDER — NICOTINE 7 MG/24HR TD PT24
7.0000 mg | MEDICATED_PATCH | Freq: Every day | TRANSDERMAL | Status: DC
  Administered 2013-04-30 – 2013-05-08 (×9): 7 mg via TRANSDERMAL
  Filled 2013-04-30 (×12): qty 1
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
## 2013-04-30 NOTE — Progress Notes (Signed)
Patient ID: Ryan Brady, male   DOB: July 14, 1946, 67 y.o.   MRN: [id number] Subjective/Complaints: 67 y.o. right-handed male with history of hypertension, diabetes mellitus and CVA. Admitted 04/24/2013 with difficulty speaking. Patient independent prior to admission without the use of assistive device. MRI of the brain shows acute right MCA territory infarct as well as bilateral chronic lacunar infarcts affecting the right and left basal ganglia. MRA of the head was subtotal occlusion left A1 ACA. Echocardiogram with ejection fraction of 70% grade 1 diastolic dysfunction without emboli. Carotid Dopplers with 0-39% right ICA stenosis. Left ICA flow not visualized past the very proximal portion, question occlusion. Patient did not receive TPA  Review of Systems  Unable to perform ROS: mental acuity  Pt wants nicotine patch,was smoker  Objective: Vital Signs: Blood pressure 120/71, pulse 85, temperature 98.5 F (36.9 C), temperature source Oral, resp. rate 20, height 5\' 9"  (1.753 m), weight 79.47 kg (175 lb 3.2 oz), SpO2 99.00%. No results found. Results for orders placed during the hospital encounter of 04/28/13 (from the past 72 hour(s))  GLUCOSE, CAPILLARY     Status: Abnormal   Collection Time    04/28/13  4:51 PM      Result Value Range   Glucose-Capillary 127 (*) 70 - 99 mg/dL  CBC     Status: Abnormal   Collection Time    04/28/13  5:21 PM      Result Value Range   WBC 12.7 (*) 4.0 - 10.5 K/uL   RBC 4.25  4.22 - 5.81 MIL/uL   Hemoglobin 12.5 (*) 13.0 - 17.0 g/dL   HCT 40.9 (*) 81.1 - 91.4 %   MCV 86.6  78.0 - 100.0 fL   MCH 29.4  26.0 - 34.0 pg   MCHC 34.0  30.0 - 36.0 g/dL   RDW 78.2 (*) 95.6 - 21.3 %   Platelets 267  150 - 400 K/uL  CREATININE, SERUM     Status: Abnormal   Collection Time    04/28/13  5:21 PM      Result Value Range   Creatinine, Ser 1.17  0.50 - 1.35 mg/dL   GFR calc non Af Amer 62 (*) >90 mL/min   GFR calc Af Amer 72 (*) >90 mL/min   Comment:          The eGFR has been calculated     using the CKD EPI equation.     This calculation has not been     validated in all clinical     situations.     eGFR's persistently     <90 mL/min signify     possible Chronic Kidney Disease.  GLUCOSE, CAPILLARY     Status: None   Collection Time    04/28/13  9:13 PM      Result Value Range   Glucose-Capillary 86  70 - 99 mg/dL   Comment 1 Notify RN    CBC WITH DIFFERENTIAL     Status: Abnormal   Collection Time    04/29/13  6:00 AM      Result Value Range   WBC 10.9 (*) 4.0 - 10.5 K/uL   RBC 4.23  4.22 - 5.81 MIL/uL   Hemoglobin 12.4 (*) 13.0 - 17.0 g/dL   HCT 08.6 (*) 57.8 - 46.9 %   MCV 86.8  78.0 - 100.0 fL   MCH 29.3  26.0 - 34.0 pg   MCHC 33.8  30.0 - 36.0 g/dL   RDW 62.9 (*) 52.8 -  15.5 %   Platelets 282  150 - 400 K/uL   Neutrophils Relative % 56  43 - 77 %   Neutro Abs 6.0  1.7 - 7.7 K/uL   Lymphocytes Relative 25  12 - 46 %   Lymphs Abs 2.7  0.7 - 4.0 K/uL   Monocytes Relative 16 (*) 3 - 12 %   Monocytes Absolute 1.7 (*) 0.1 - 1.0 K/uL   Eosinophils Relative 3  0 - 5 %   Eosinophils Absolute 0.3  0.0 - 0.7 K/uL   Basophils Relative 1  0 - 1 %   Basophils Absolute 0.1  0.0 - 0.1 K/uL  COMPREHENSIVE METABOLIC PANEL     Status: Abnormal   Collection Time    04/29/13  6:00 AM      Result Value Range   Sodium 136  135 - 145 mEq/L   Potassium 3.4 (*) 3.5 - 5.1 mEq/L   Chloride 103  96 - 112 mEq/L   CO2 23  19 - 32 mEq/L   Glucose, Bld 93  70 - 99 mg/dL   BUN 15  6 - 23 mg/dL   Creatinine, Ser 1.61  0.50 - 1.35 mg/dL   Calcium 8.8  8.4 - 09.6 mg/dL   Total Protein 6.4  6.0 - 8.3 g/dL   Albumin 3.0 (*) 3.5 - 5.2 g/dL   AST 24  0 - 37 U/L   ALT 26  0 - 53 U/L   Alkaline Phosphatase 121 (*) 39 - 117 U/L   Total Bilirubin 0.3  0.3 - 1.2 mg/dL   GFR calc non Af Amer 61 (*) >90 mL/min   GFR calc Af Amer 70 (*) >90 mL/min   Comment:            The eGFR has been calculated     using the CKD EPI equation.     This calculation has  not been     validated in all clinical     situations.     eGFR's persistently     <90 mL/min signify     possible Chronic Kidney Disease.  GLUCOSE, CAPILLARY     Status: None   Collection Time    04/29/13  7:44 AM      Result Value Range   Glucose-Capillary 99  70 - 99 mg/dL   Comment 1 Notify RN    GLUCOSE, CAPILLARY     Status: Abnormal   Collection Time    04/29/13 11:34 AM      Result Value Range   Glucose-Capillary 127 (*) 70 - 99 mg/dL   Comment 1 Notify RN       Eyes: EOM are normal.  Neck: Normal range of motion. Neck supple. No thyromegaly present.  Cardiovascular: Normal rate and regular rhythm.  Pulmonary/Chest: Effort normal and breath sounds normal. No respiratory distress.  Abdominal: Soft. Bowel sounds are normal. He exhibits no distension.  Neurological: He is alert.  Patient is dysarthric and and limited to following simple commands. Poor awareness of deficits and decreased attention  Skin: Skin is warm and dry.  visual fields are intact the confrontational testing, left neglect noted with double simultaneous stimulation  Neuro:  Eyes without evidence of nystagmus  Tone is normal without evidence of spasticity  Cerebellar exam shows no evidence of ataxia on finger nose finger or heel to shin testing  No evidence of trunkal ataxia  Motor strength is 5/5 in bilateral deltoid, biceps, triceps, finger flexors and extensors,  wrist flexors and extensors, hip flexors, knee flexors and extensors, ankle dorsiflexors, plantar flexors, invertors and evertors, toe flexors and extensors     Assessment/Plan: 1. Functional deficits secondary to acute right thrombotic MCA infarct superimposed on bilateral basal ganglia chronic infarcts which require 3+ hours per day of interdisciplinary therapy in a comprehensive inpatient rehab setting. Physiatrist is providing close team supervision and 24 hour management of active medical problems listed below. Physiatrist and rehab team  continue to assess barriers to discharge/monitor patient progress toward functional and medical goals. FIM: FIM - Bathing Bathing Steps Patient Completed: Chest;Right lower leg (including foot);Right Arm;Left Arm;Abdomen;Front perineal area;Buttocks;Right upper leg;Left upper leg Bathing: 4: Min-Patient completes 8-9 30f 10 parts or 75+ percent  FIM - Upper Body Dressing/Undressing Upper body dressing/undressing steps patient completed: Thread/unthread left sleeve of pullover shirt/dress;Thread/unthread right sleeve of pullover shirt/dresss;Put head through opening of pull over shirt/dress;Pull shirt over trunk Upper body dressing/undressing: 5: Set-up assist to: Obtain clothing/put away FIM - Lower Body Dressing/Undressing Lower body dressing/undressing steps patient completed: Thread/unthread right underwear leg;Thread/unthread left underwear leg;Pull underwear up/down;Thread/unthread right pants leg;Thread/unthread left pants leg;Pull pants up/down;Don/Doff right shoe Lower body dressing/undressing: 3: Mod-Patient completed 50-74% of tasks  FIM - Toileting Toileting steps completed by patient: Adjust clothing prior to toileting;Performs perineal hygiene;Adjust clothing after toileting Toileting Assistive Devices: Grab bar or rail for support Toileting: 4: Assist with fasteners  FIM - Diplomatic Services operational officer Devices: Grab bars Toilet Transfers: 5-From toilet/BSC: Supervision (verbal cues/safety issues)  FIM - Banker Devices: Arm rests Bed/Chair Transfer: 5: Bed > Chair or W/C: Supervision (verbal cues/safety issues);5: Chair or W/C > Bed: Supervision (verbal cues/safety issues)  FIM - Locomotion: Wheelchair Locomotion: Wheelchair: 0: Activity did not occur FIM - Locomotion: Ambulation Locomotion: Ambulation Assistive Devices: Other (comment) (one person hand held assist) Ambulation/Gait Assistance: 4: Min guard Locomotion:  Ambulation: 4: Travels 150 ft or more with minimal assistance (Pt.>75%)  Comprehension Comprehension Mode: Auditory Comprehension: 4-Understands basic 75 - 89% of the time/requires cueing 10 - 24% of the time  Expression Expression Mode: Verbal Expression: 3-Expresses basic 50 - 74% of the time/requires cueing 25 - 50% of the time. Needs to repeat parts of sentences.  Social Interaction Social Interaction: 3-Interacts appropriately 50 - 74% of the time - May be physically or verbally inappropriate.  Problem Solving Problem Solving: 2-Solves basic 25 - 49% of the time - needs direction more than half the time to initiate, plan or complete simple activities  Memory Memory: 2-Recognizes or recalls 25 - 49% of the time/requires cueing 51 - 75% of the time  Medical Problem List and Plan:  1. Thrombotic right MCA infarct  2. DVT Prophylaxis/Anticoagulation: Subcutaneous Lovenox. Monitor platelet counts any signs of bleeding  3. Pain Management: Tylenol as needed  4. Dysphagia. Dysphagia to thin liquid diet. Monitor for any signs of aspiration and followup speech therapy  5. Mood/depression. Paxil 20 mg daily. Provide emotional support  6. Neuropsych: This patient is not capable of making decisions on his own behalf.  7. Hypertension. Lisinopril 40 mg daily. Monitor with increased activity  8. ?Diabetes mellitus. Question diagnosis Hemoglobin A1c 5.6. Patient is diet controlled.  blood sugars a.c. and at bedtime are normal will D/C 9.  Tobacco abuse, will order nicoderm   LOS (Days) 2 A FACE TO FACE EVALUATION WAS PERFORMED  Chelcee Korpi E 04/30/2013, 6:35 PM

## 2013-04-30 NOTE — Patient Care Conference (Signed)
Inpatient RehabilitationTeam Conference and Plan of Care Update Date: 04/29/2013   Time: 11:25 Am    Patient Name: Ryan Brady      Medical Record Number: [id number]  Date of Birth: 13-Apr-1946 Sex: Male         Room/Bed: 4W07C/4W07C-01 Payor Info: Payor: MEDICARE / Plan: MEDICARE PART A AND B / Product Type: *No Product type* /    Admitting Diagnosis: RT CVA  Admit Date/Time:  04/28/2013  4:18 PM Admission Comments: No comment available   Primary Diagnosis:  Thrombotic cerebral infarction Principal Problem: Thrombotic cerebral infarction  Patient Active Problem List   Diagnosis Date Noted  . 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 Hypokalemia 04/26/2013  . CVA (cerebral infarction) 04/24/2013  . Accelerated hypertension 04/24/2013  . Leukocytosis 04/24/2013  . Depression 04/24/2013    Expected Discharge Date: Expected Discharge Date: 05/08/13  Team Members Present: Physician leading conference: Dr. Claudette Laws Social Worker Present: Dossie Der, LCSW Nurse Present: Chana Bode, RN PT Present: Edman Circle, PT;Becky Joycelyn Das, PT OT Present: Other (comment);Leonette Monarch, Loistine Chance, OT [facility] [place]_OT) SLP Present: Fae Pippin, SLP     Current Status/Progress Goal Weekly Team Focus  Medical   poor cognition, poor safety awareness  improve safety awareness  Speech eval   Bowel/Bladder     incont B & B   Bladder and bowel program   Begin bowel and bladder program  Swallow/Nutrition/ Hydration   eval pending         ADL's   min assist UB dressing, mod assist LB dressing, min assist functional transfers via ambulation with hand held assist, posterior lean when sitting unsupported, poor safety awareness  supervision   safety awareness, initiation, L attention, functinal transfers, ADL retraining, standing and sitting balance   Mobility     eval pending        Communication   eval pending         Safety/Cognition/ Behavioral  Observations  eval pending          Pain     na        Skin     monitor due to incontience      monitor skin for breakdown     *See Care Plan and progress notes for long and short-term goals.  Barriers to Discharge: ? family/ caregiver availability    Possible Resolutions to Barriers:  cont program    Discharge Planning/Teaching Needs:    Home with son assisting-need to confirm this plan.  Both pt and son have visual deficits.  Pt alone PTA     Team Discussion:  Goals leaning toward supervision level.  No safety awareness.  Incont of bladder and bowel  Revisions to Treatment Plan:  New eval   Continued Need for Acute Rehabilitation Level of Care: The patient requires daily medical management by a physician with specialized training in physical medicine and rehabilitation for the following conditions: Daily direction of a multidisciplinary physical rehabilitation program to ensure safe treatment while eliciting the highest outcome that is of practical value to the patient.: Yes Daily medical management of patient stability for increased activity during participation in an intensive rehabilitation regime.: Yes Daily analysis of laboratory values and/or radiology reports with any subsequent need for medication adjustment of medical intervention for : Neurological problems  Ryan Brady, Lemar Livings 05/01/2013, 9:20 AM

## 2013-04-30 NOTE — Plan of Care (Addendum)
Overall Plan of Care [facility]) Patient Details Name: Ryan Brady MRN: [id number] DOB: [birth year]  Diagnosis:  Right MCA infarct  Co-morbidities: Chronic bilateral basal ganglia infarcts, aphasia, apraxia  Functional Problem List  Patient demonstrates impairments in the following areas: Balance, Bladder, Bowel, Cognition, Medication Management and Safety  Basic ADL's: eating, grooming, bathing, dressing and toileting Advanced ADL's: simple meal preparation  Transfers:  bed mobility, bed to chair and car Locomotion:  ambulation and stairs  Additional Impairments:  Functional use of upper extremity, Communication  comprehension and expression and Social Cognition   social interaction, problem solving, memory, attention and awareness  Anticipated Outcomes Item Anticipated Outcome  Eating/Swallowing  Supervision  Basic self-care  Supervision   Tolieting  Supervision   Bowel/Bladder  Min assist  Transfers  Supervision  Locomotion  Supervision  Communication  Min assist  Cognition  Mod assist  Pain  Min assist  Safety/Judgment  Mod assist  Other     Therapy Plan: PT Intensity: Minimum of 1-2 x/day ,45 to 90 minutes PT Frequency: 5 out of 7 days PT Duration Estimated Length of Stay: 10 days OT Intensity: Minimum of 1-2 x/day, 45 to 90 minutes OT Frequency: 5 out of 7 days OT Duration/Estimated Length of Stay: 10-12 days SLP Intensity: Minumum of 1-2 x/day, 30 to 90 minutes SLP Frequency: 5 out of 7 days SLP Duration/Estimated Length of Stay: 7-10 days    Team Interventions: Item RN PT OT SLP SW TR Other  Self Care/Advanced ADL Retraining   x      Neuromuscular Re-Education  x x      Therapeutic Activities  x x      UE/LE Strength Training/ROM  x x      UE/LE Coordination Activities  x x      Visual/Perceptual Remediation/Compensation  x x      DME/Adaptive Equipment Instruction  x x      Therapeutic Exercise  x x      Balance/Vestibular Training  x x       Patient/Family Education x x x      Cognitive Remediation/Compensation  x x      Functional Mobility Training  x x      Ambulation/Gait Training  x       Furniture conservator/restorer Reintegration  x x      Dysphagia/Aspiration Film/video editor         Bladder Management x        Bowel Management x        Disease Management/Prevention x        Pain Management x  x      Medication Management x        Skin Care/Wound Management         Splinting/Orthotics         Discharge Planning  x x      Psychosocial Support x x x                          Team Discharge Planning: Destination: PT-Home ,OT- Home , SLP-Home Projected Follow-up: PT-Outpatient PT;24 hour supervision/assistance, OT-  Home health OT;24 hour supervision/assistance, SLP-Home Health SLP;24 hour supervision/assistance Projected Equipment Needs: PT-None recommended by PT, OT- Tub/shower bench,  SLP-None recommended by SLP Patient/family involved in discharge planning: PT- Patient,  OT-Patient, SLP-Patient  MD ELOS: 2 weeks Medical Rehab Prognosis:  Fair Assessment: 67 year old male with prior history of bilateral cerebral infarcts admitted with new right MCA infarct. Now requiring 24/7 Rehab RN,MD, as well as CIR level PT, OT and SLP.  Treatment team will focus on ADLs and mobility with goals set at Supervision to minimal assistance    See Team Conference Notes for weekly updates to the plan of care

## 2013-04-30 NOTE — Progress Notes (Signed)
Speech Language Pathology Daily Session Note  Patient Details  Name: Ryan Brady MRN: [id number] Date of Birth: 08-28-1946  Today's Date: 04/30/2013 Time: 4098-1191 Time Calculation (min): 40 min  Short Term Goals: Week 1: SLP Short Term Goal 1 (Week 1): Pt will utilize speech intelligbility strategies to increase intelligibility to 75% at the phrase level with Min A verbal cues.  SLP Short Term Goal 2 (Week 1): Pt will demonstrate sustained attention to a functional task for 5 minutes with Min A verbal and question cues for redirection.  SLP Short Term Goal 3 (Week 1): Pt will demonstrate functional problem solving for basic and familiar tasks with Mod A verbal cues.  SLP Short Term Goal 4 (Week 1): Pt will utilize call bell to express wants/needs with Mod A verbal and question cues.  SLP Short Term Goal 5 (Week 1): Pt will identify 2 physical and 2 cognitive deficits with Mod A question and contextual cues.  SLP Short Term Goal 6 (Week 1): Pt will utilize word-finding strategies with Min A verbal and question cues to increase functional communication  Skilled Therapeutic Interventions: Skilled treatment session focused on addressing dysphagia and cognition goals.  SLP facilitated session with breakfast tray and increased wait time to set-up tray.  SLP attempted to provide verbal cues and offered physical assist; however, patient declined both.  Patient demonstrated poor organizational and problem solving skills initially; however, was functional with increased wait time. Patient consumed Dys.3 textures with 5 bites with limited mastication and prior to initiation of swallow verbal cues were not effective and SLP had to provide tactile cues of removing utensils and food to stop scooping and finger feeding and focused on mastication and initiation of swallow.  As session progressed patient would consume 2-3 bites and offer of a sip would shift focus to mastication of current boluses during sips.   Patient exhibited coughs throughout session suspected to be related to amount of pocketing.  Continue with full staff supervision and SLP recommends 24/7 supervision after discharge.     FIM:  Comprehension Comprehension Mode: Auditory Comprehension: 4-Understands basic 75 - 89% of the time/requires cueing 10 - 24% of the time Expression Expression Mode: Verbal Expression: 3-Expresses basic 50 - 74% of the time/requires cueing 25 - 50% of the time. Needs to repeat parts of sentences. Social Interaction Social Interaction: 3-Interacts appropriately 50 - 74% of the time - May be physically or verbally inappropriate. Problem Solving Problem Solving: 2-Solves basic 25 - 49% of the time - needs direction more than half the time to initiate, plan or complete simple activities Memory Memory: 2-Recognizes or recalls 25 - 49% of the time/requires cueing 51 - 75% of the time FIM - Eating Eating Activity: 4: Helper checks for pocketed food  Pain Pain Assessment Pain Assessment: No/denies pain  Therapy/Group: Individual Therapy  Ryan Brady., CCC-SLP [phone]  Ryan Brady 04/30/2013, 3:01 PM

## 2013-04-30 NOTE — Plan of Care (Signed)
Problem: RH BLADDER ELIMINATION Goal: RH STG MANAGE BLADDER WITH ASSISTANCE STG Manage Bladder With mod Assistance  Outcome: Progressing Condom cath hs; timed toileting during day

## 2013-04-30 NOTE — Progress Notes (Signed)
Social Work Assessment and Plan Social Work Assessment and Plan  Patient Details  Name: Ryan Brady MRN: [id number] Date of Birth: [birth year]  Today's Date: 04/30/2013  Problem List:  Patient Active Problem List   Diagnosis Date Noted  . 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 Hypokalemia 04/26/2013  . CVA (cerebral infarction) 04/24/2013  . Accelerated hypertension 04/24/2013  . Leukocytosis 04/24/2013  . Depression 04/24/2013   Past Medical History:  Past Medical History  Diagnosis Date  . Stroke   . Diabetes mellitus without complication   . Hypertension    Past Surgical History:  Past Surgical History  Procedure Laterality Date  . Tonsillectomy     Social History:  reports that he has been smoking.  He has never used smokeless tobacco. He reports that he does not drink alcohol or use illicit drugs.  Family / Support Systems Marital Status: Widow/Widower Patient Roles: Parent Children: Alvin-son [phone] Anticipated Caregiver: Son and daughter in-law Ability/Limitations of Caregiver: Son has visual deficts-legally blind, daughter in-law works during the day-home at night Caregiver Availability: 24/7 Family Dynamics: Close knit small family who want the best for their Father.  Son realizes his Dad needs someone with him after the numerous strokes he has had.  Social History Preferred language: English Religion:  Cultural Background: No issues Education: High School Read: Yes Write: Yes Employment Status: Retired Fish farm manager Issues: No issues Guardian/Conservator: None-according to MD pt is not capable of making his own decisions-will rely upon son to make decisions, since he is next of kin   Abuse/Neglect Physical Abuse: Denies Verbal Abuse: Denies Sexual Abuse: Denies Exploitation of patient/patient's resources: Denies Self-Neglect: Denies  Emotional Status Pt's affect, behavior adn adjustment status: Pt is motivated and very  soft spoken.  He wants to do well here and would like to return to his apartment on discharge.  He is workin gin therapies, but has cognitive deficits from his numerous strokes. Recent Psychosocial Issues: Other medical issues-numeorus previous strokes Pyschiatric History: No history-deferred depression screen due to cognitive deficits.  Will monitor his coping while here and do screen when approrpriate and/or have [facility] get involved if team feels necessary Substance Abuse History: No issues  Patient / Family Perceptions, Expectations & Goals Pt/Family understanding of illness & functional limitations: Pt and son can explain his stroke, his son has a better understanding of Dad's stroke deficits than pt.  Pt realizes he had another one and wnats to improve, unsure if he realizes the cognitive deficits he has.  Informed team recommends someone to be with him at discharge. Premorbid pt/family roles/activities: Father, Retiree, Mining engineer, etc Anticipated changes in roles/activities/participation: resume Pt/family expectations/goals: Pt states: " I want to get better to go home."  Son states: ' I want him safe and mobile, he is doing much better."  [employer]: Other (Comment) (Had in past) Premorbid Home Care/DME Agencies: Other (Comment) (Had in past) Transportation available at discharge: Family Resource referrals recommended: Support group (specify) ([facility]A Support group)  Discharge Planning Living Arrangements: Alone Support Systems: Children;Friends/neighbors Type of Residence: Private residence Insurance Resources: [employer] Financial Resources: Restaurant manager, fast food Screen Referred: No Living Expenses: Rent Money Management: Patient Does the patient have any problems obtaining your medications?: No Home Management: Self, daughter in-law would assist also Patient/Family Preliminary Plans: Son looking into moving into a three bedroom so pt can stay  with them.  Informed son team recommends 24 hour supervision for safety, due to his cognition issues.  Son  is disabled and can be there with him unitl they get apartment situation resolved.  It may take time to move and get apartment. Social Work Anticipated Follow Up Needs: HH/OP;Support Group  Clinical Impression Pleasant soft spoken gentleman who is motivated to improve and get as independent as possible-likes to be active and walk.  Supportive son and daughter in-law Who are willing to assist him, working on obtaining a three bedroom apartment for all of them.  Work on a safe discharge plan.  Lucy Chris 04/30/2013, 10:56 AM

## 2013-04-30 NOTE — Care Management Note (Signed)
[facility] Individual Statement of Services  Patient Name:  Ryan Brady  Date:  04/30/2013  [facility].  Our goal is to provide you with an individualized program based on your diagnosis and situation, designed to meet your specific needs.  With this comprehensive rehabilitation program, you will be expected to participate in at least 3 hours of rehabilitation therapies Monday-Friday, with modified therapy programming on the weekends.  Your rehabilitation program will include the following services:  Physical Therapy (PT), Occupational Therapy (OT), Speech Therapy (ST), 24 hour per day rehabilitation nursing, Case Management ( Social Worker), Rehabilitation Medicine, Nutrition Services and Pharmacy Services  Weekly team conferences will be held on Wednesday to discuss your progress.  Your Social Worker will talk with you frequently to get your input and to update you on team discussions.  Team conferences with you and your family in attendance may also be held.  Expected length of stay: 10-12 days Overall anticipated outcome: supervision level for safety  Depending on your progress and recovery, your program may change. Your Social Worker will coordinate services and will keep you informed of any changes. Your Child psychotherapist names and contact numbers are listed  below.  The following services may also be recommended but are not provided by the [facility]:    Home Health Rehabiltiation Services  Outpatient Rehabilitatation Servives    Arrangements will be made to provide these services after discharge if needed.  Arrangements include referral to agencies that provide these services.  Your insurance has been verified to be:  Medicare Your primary doctor is:  None  Pertinent information will be shared with your doctor and your insurance company.  Social Worker:  Dossie Der, [place] [phone]  Information  discussed with and copy given to patient by: Lucy Chris, 04/30/2013, 10:42 AM

## 2013-04-30 NOTE — Progress Notes (Signed)
Occupational Therapy Session Note  Patient Details  Name: Ryan Brady MRN: [id number] Date of Birth: 1946-08-11  Today's Date: 04/30/2013 Time: 0730-0830 and 1300-1330 Time Calculation (min): 60 min and 30 min   Short Term Goals: Week 1:  OT Short Term Goal 1 (Week 1): Refer to LTGs secondary to expected short LOS  Skilled Therapeutic Interventions/Progress Updates:    Session 1: Therapy session focused on ADL retraining, initiation, dynamic standing balance and cognitive skills. Pt received supine in bed and requested to take shower. Pt ambulated from bed to toilet then to walk in shower with min assist for balance using hand held technique. Completed shower with increased time and required cues to problem solve to wash feet. Used crossover tech to was R foot however unable to with L foot. Required min assist for standing balance during shower. Decreased safety awareness as pt unable to say why water on the floor was unsafe. Educated on wet floor being a fall risk. Pt required cues to rinse off after shower as he reported he was done while still covered in soap. Completed dressing sitting unsupported eob with improved postural control. Has shoe horn however difficulty with using it for L shoe. Pt with increased attention to L as he retrieved all clothing items on L side without cues and used head turn to locate. Completed oral care in standing with min assist for balance.  Session 2: Therapy session focused on dynamic standing balance, L attention, and awareness. Pt ambulated hand held assist from room to pt laundry room with min assist to retrieve clothing. Pt with R head turn and gaze when ambulating down hallway. Cued to attend to L and cued to look straight forward however pt with decreased awareness stating he was looking forward. Retrieved clothing to place in bag that OT was holding to L side. Pt required several cues to turn head to locate bag before attempting to place clothing as he was  missing the bag. Ambulated back to room while carrying bag in R hand as he refused to hold in L when OT asked. Pt very distracted by people walking down hallways and required cues to re-direct. Pt took rest break and OT placed all items on L side then had pt stand to fold clothing. Standing balance with min assist during task and as he turned to retrieve items. Pt with impaired intellectual awareness as he did not understand why OT providing assist with standing balance. Attempted to show by providing less support and pt with slight lob posteriorly however not aware of this.    Therapy Documentation Precautions:  Precautions Precautions: Fall Precaution Comments: L sided weakness (UE >LE) and some mild L sided inattention.  Restrictions Weight Bearing Restrictions: No General:   Vital Signs: Therapy Vitals Temp: 99.8 F (37.7 C) Temp src: Oral Pulse Rate: 89 Resp: 19 BP: 127/80 mmHg Patient Position, if appropriate: Lying Oxygen Therapy SpO2: 97 % O2 Device: None (Room air) Pain: No c/o pain during therapy session.   Other Treatments:    See FIM for current functional status  Therapy/Group: Individual Therapy  Daneil Dan 04/30/2013, 9:28 AM

## 2013-04-30 NOTE — Plan of Care (Signed)
Problem: RH BOWEL ELIMINATION Goal: RH STG MANAGE BOWEL WITH ASSISTANCE STG Manage Bowel with mod Assistance.  Outcome: Not Progressing Pt incont of stool in brief while sitting in chair, did not inform staff.

## 2013-05-01 ENCOUNTER — Inpatient Hospital Stay (HOSPITAL_COMMUNITY): Payer: Medicare Other

## 2013-05-01 ENCOUNTER — Inpatient Hospital Stay (HOSPITAL_COMMUNITY): Payer: Medicare Other | Admitting: Physical Therapy

## 2013-05-01 ENCOUNTER — Inpatient Hospital Stay (HOSPITAL_COMMUNITY): Payer: Medicare Other | Admitting: Speech Pathology

## 2013-05-01 DIAGNOSIS — I6330 Cerebral infarction due to thrombosis of unspecified cerebral artery: Secondary | ICD-10-CM

## 2013-05-01 DIAGNOSIS — I69993 Ataxia following unspecified cerebrovascular disease: Secondary | ICD-10-CM

## 2013-05-01 MED ORDER — BACLOFEN 5 MG HALF TABLET
5.0000 mg | ORAL_TABLET | Freq: Two times a day (BID) | ORAL | Status: DC
  Administered 2013-05-01 (×2): 5 mg via ORAL
  Filled 2013-05-01 (×7): qty 1

## 2013-05-01 NOTE — Progress Notes (Signed)
Speech Language Pathology Daily Session Note  Patient Details  Name: Ryan Brady MRN: [id number] Date of Birth: 09-10-1946  Today's Date: 05/01/2013 Time: 9147-8295 Time Calculation (min): 30 min  Short Term Goals: Week 1: SLP Short Term Goal 1 (Week 1): Pt will utilize speech intelligbility strategies to increase intelligibility to 75% at the phrase level with Min A verbal cues.  SLP Short Term Goal 2 (Week 1): Pt will demonstrate sustained attention to a functional task for 5 minutes with Min A verbal and question cues for redirection.  SLP Short Term Goal 3 (Week 1): Pt will demonstrate functional problem solving for basic and familiar tasks with Mod A verbal cues.  SLP Short Term Goal 4 (Week 1): Pt will utilize call bell to express wants/needs with Mod A verbal and question cues.  SLP Short Term Goal 5 (Week 1): Pt will identify 2 physical and 2 cognitive deficits with Mod A question and contextual cues.  SLP Short Term Goal 6 (Week 1): Pt will utilize word-finding strategies with Min A verbal and question cues to increase functional communication  Skilled Therapeutic Interventions: Skilled treatment session focused on addressing cognition goals.  SLP facilitated session with basic money management task and Max assist cues to utilize external aids for recall due to working memory impairments as well as cues to initiate, organize and sequence counting tasks.  Patient also required Max assist for subtraction and Mod for addition problems as it related to money management tasks. Son present for session and observed; son reported he use to do better at this.     FIM:  Comprehension Comprehension Mode: Auditory Comprehension: 4-Understands basic 75 - 89% of the time/requires cueing 10 - 24% of the time Expression Expression Mode: Verbal Expression: 3-Expresses basic 50 - 74% of the time/requires cueing 25 - 50% of the time. Needs to repeat parts of sentences. Social Interaction Social  Interaction: 3-Interacts appropriately 50 - 74% of the time - May be physically or verbally inappropriate. Problem Solving Problem Solving: 2-Solves basic 25 - 49% of the time - needs direction more than half the time to initiate, plan or complete simple activities Memory Memory: 2-Recognizes or recalls 25 - 49% of the time/requires cueing 51 - 75% of the time FIM - Eating Eating Activity: 4: Helper checks for pocketed food  Pain Pain Assessment Pain Assessment: No/denies pain  Therapy/Group: Individual Therapy  Charlane Ferretti., CCC-SLP [phone]  Leanord Thibeau 05/01/2013, 4:11 PM

## 2013-05-01 NOTE — Progress Notes (Signed)
Occupational Therapy Note  Patient Details  Name: Calan Doren MRN: [id number] Date of Birth: 1946/10/03 Today's Date: 05/01/2013  Session Note: Co-treatment with ST Time: 1130-1200 (30 mins) Pt with no report of pain.  Pt participated in [employer] with two other patients and ST present. Skilled intervention focused on L side inattention/neglect, self-feeding, fxal use of BUE's, and visual scanning to L side. Pt able to feed self with supervision. At end of session, rehab tech pushed pt->room.  Ismaeel Arvelo 05/01/2013, 7:30 AM
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
## 2013-05-01 NOTE — Progress Notes (Signed)
Social Work Patient ID: Ryan Brady, male   DOB: 1946-10-10, 67 y.o.   MRN: [id number] Met with son while here observing Dad in therapies.  He reports the plan is for Dad to move in with he and hs wife upon discharge.  He will  talk with Dad regarding trying in therapies and allowing male staff to [facility] him.  He feels Dad is old school and does not want younger staff To toliet him.  He is stubborn and set in his ways and this last stroke has affected him.  He plans to be here and observe in therapies.  Gave him the letter That hopefully will assist them in changing apartments without penalty.  Continue to work on discharge needs.

## 2013-05-01 NOTE — Progress Notes (Signed)
Note reviewed and accurately reflects treatment session.   

## 2013-05-01 NOTE — Progress Notes (Signed)
Occupational Therapy Session Note  Patient Details  Name: Ryan Brady MRN: [id number] Date of Birth: Nov 29, 1945  Today's Date: 05/01/2013 Time: 0730-0827 Time Calculation (min): 57 min  Short Term Goals: Week 1:  OT Short Term Goal 1 (Week 1): Refer to LTGs secondary to expected short LOS  Skilled Therapeutic Interventions/Progress Updates:    Therapy session on ADL retraining focused on functional ambulation, dynamic standing balance, problem solving, and safety awareness. Pt complete shower on this date requiring 2 cues to wash body parts and required min assist for standing balance when washing buttocks. Pt had BM in shower however unaware and denied it when OT informed him. Pt ambulated bed<>shower with steadying assist. Completed dressing while sitting in recliner chair and had lob posteriorly when attempting to fasten pants requiring min assist to correct. Pt continues to demonstrate difficulty with tying shoes however able to tie R shoe on this date with increased time. Pt with decreased safety awareness. Dislikes quick release belt and informed him of why it is used. Pt does not understand that he is a fall risk and his balance is not as good as before. Asked pt what he would do if no one was in room and he needed to go to bathroom. Pt reported with "I would just go." Educated on using call light and that someone has to be with him. Pt continued to have decreased awareness then stated "I would still go."  Therapy Documentation Precautions:  Precautions Precautions: Fall Precaution Comments: L sided weakness (UE >LE) and some mild L sided inattention.  Restrictions Weight Bearing Restrictions: No General:   Vital Signs: Therapy Vitals Temp: 98 F (36.7 C) Temp src: Oral Pulse Rate: 74 Resp: 19 BP: 137/83 mmHg Patient Position, if appropriate: Lying Oxygen Therapy SpO2: 99 % O2 Device: None (Room air) Pain: No c/o pain during therapy session.  See FIM for current  functional status  Therapy/Group: Individual Therapy  Daneil Dan 05/01/2013, 8:37 AM

## 2013-05-01 NOTE — Progress Notes (Signed)
Speech Language Pathology Daily Session Note  Patient Details  Name: Ryan Brady MRN: [id number] Date of Birth: 19-Jan-1946  Today's Date: 05/01/2013 Time: 1200-1230 Time Calculation (min): 30 min  Short Term Goals: Week 1: SLP Short Term Goal 1 (Week 1): Pt will utilize speech intelligbility strategies to increase intelligibility to 75% at the phrase level with Min A verbal cues.  SLP Short Term Goal 2 (Week 1): Pt will demonstrate sustained attention to a functional task for 5 minutes with Min A verbal and question cues for redirection.  SLP Short Term Goal 3 (Week 1): Pt will demonstrate functional problem solving for basic and familiar tasks with Mod A verbal cues.  SLP Short Term Goal 4 (Week 1): Pt will utilize call bell to express wants/needs with Mod A verbal and question cues.  SLP Short Term Goal 5 (Week 1): Pt will identify 2 physical and 2 cognitive deficits with Mod A question and contextual cues.  SLP Short Term Goal 6 (Week 1): Pt will utilize word-finding strategies with Min A verbal and question cues to increase functional communication  Skilled Therapeutic Interventions: Group, co-treatment session with OT addressed dysphagia and self-feeding goals.  SLP facilitated session with increased wait time and Min assist verbal cues to problem solve set-up.  Patient consumed Dys.3 textures and thin liquids via cup with cough x1 and Mod assist question cues to alternate between bites and sips.  Continue with current plan of care.    FIM:  Comprehension Comprehension Mode: Auditory Comprehension: 4-Understands basic 75 - 89% of the time/requires cueing 10 - 24% of the time Expression Expression Mode: Verbal Expression: 3-Expresses basic 50 - 74% of the time/requires cueing 25 - 50% of the time. Needs to repeat parts of sentences. Social Interaction Social Interaction: 3-Interacts appropriately 50 - 74% of the time - May be physically or verbally inappropriate. Problem  Solving Problem Solving: 2-Solves basic 25 - 49% of the time - needs direction more than half the time to initiate, plan or complete simple activities Memory Memory: 2-Recognizes or recalls 25 - 49% of the time/requires cueing 51 - 75% of the time FIM - Eating Eating Activity: 4: Helper checks for pocketed food  Pain Pain Assessment Pain Assessment: No/denies pain  Therapy/Group: Group Therapy  Charlane Ferretti., CCC-SLP [phone]  Overton Boggus 05/01/2013, 1:15 PM

## 2013-05-01 NOTE — Progress Notes (Signed)
Patient ID: Ryan Brady, male   DOB: 12-23-1945, 67 y.o.   MRN: [id number] Subjective/Complaints: 67 y.o. right-handed male with history of hypertension, diabetes mellitus and CVA. Admitted 04/24/2013 with difficulty speaking. Patient independent prior to admission without the use of assistive device. MRI of the brain shows acute right MCA territory infarct as well as bilateral chronic lacunar infarcts affecting the right and left basal ganglia. MRA of the head was subtotal occlusion left A1 ACA. Echocardiogram with ejection fraction of 70% grade 1 diastolic dysfunction without emboli. Carotid Dopplers with 0-39% right ICA stenosis. Left ICA flow not visualized past the very proximal portion, question occlusion. Patient did not receive TPA Moderate receptive and expressive language deficits Review of Systems  Unable to perform ROS: mental acuity  Pt wants nicotine patch,was smoker  Objective: Vital Signs: Blood pressure 137/83, pulse 74, temperature 98 F (36.7 C), temperature source Oral, resp. rate 19, height 5\' 9"  (1.753 m), weight 79.47 kg (175 lb 3.2 oz), SpO2 99.00%. No results found. Results for orders placed during the hospital encounter of 04/28/13 (from the past 72 hour(s))  GLUCOSE, CAPILLARY     Status: Abnormal   Collection Time    04/28/13  4:51 PM      Result Value Range   Glucose-Capillary 127 (*) 70 - 99 mg/dL  CBC     Status: Abnormal   Collection Time    04/28/13  5:21 PM      Result Value Range   WBC 12.7 (*) 4.0 - 10.5 K/uL   RBC 4.25  4.22 - 5.81 MIL/uL   Hemoglobin 12.5 (*) 13.0 - 17.0 g/dL   HCT 40.9 (*) 81.1 - 91.4 %   MCV 86.6  78.0 - 100.0 fL   MCH 29.4  26.0 - 34.0 pg   MCHC 34.0  30.0 - 36.0 g/dL   RDW 78.2 (*) 95.6 - 21.3 %   Platelets 267  150 - 400 K/uL  CREATININE, SERUM     Status: Abnormal   Collection Time    04/28/13  5:21 PM      Result Value Range   Creatinine, Ser 1.17  0.50 - 1.35 mg/dL   GFR calc non Af Amer 62 (*) >90 mL/min   GFR calc  Af Amer 72 (*) >90 mL/min   Comment:            The eGFR has been calculated     using the CKD EPI equation.     This calculation has not been     validated in all clinical     situations.     eGFR's persistently     <90 mL/min signify     possible Chronic Kidney Disease.  GLUCOSE, CAPILLARY     Status: None   Collection Time    04/28/13  9:13 PM      Result Value Range   Glucose-Capillary 86  70 - 99 mg/dL   Comment 1 Notify RN    CBC WITH DIFFERENTIAL     Status: Abnormal   Collection Time    04/29/13  6:00 AM      Result Value Range   WBC 10.9 (*) 4.0 - 10.5 K/uL   RBC 4.23  4.22 - 5.81 MIL/uL   Hemoglobin 12.4 (*) 13.0 - 17.0 g/dL   HCT 08.6 (*) 57.8 - 46.9 %   MCV 86.8  78.0 - 100.0 fL   MCH 29.3  26.0 - 34.0 pg   MCHC 33.8  30.0 - 36.0  g/dL   RDW 16.1 (*) 09.6 - 04.5 %   Platelets 282  150 - 400 K/uL   Neutrophils Relative % 56  43 - 77 %   Neutro Abs 6.0  1.7 - 7.7 K/uL   Lymphocytes Relative 25  12 - 46 %   Lymphs Abs 2.7  0.7 - 4.0 K/uL   Monocytes Relative 16 (*) 3 - 12 %   Monocytes Absolute 1.7 (*) 0.1 - 1.0 K/uL   Eosinophils Relative 3  0 - 5 %   Eosinophils Absolute 0.3  0.0 - 0.7 K/uL   Basophils Relative 1  0 - 1 %   Basophils Absolute 0.1  0.0 - 0.1 K/uL  COMPREHENSIVE METABOLIC PANEL     Status: Abnormal   Collection Time    04/29/13  6:00 AM      Result Value Range   Sodium 136  135 - 145 mEq/L   Potassium 3.4 (*) 3.5 - 5.1 mEq/L   Chloride 103  96 - 112 mEq/L   CO2 23  19 - 32 mEq/L   Glucose, Bld 93  70 - 99 mg/dL   BUN 15  6 - 23 mg/dL   Creatinine, Ser 4.09  0.50 - 1.35 mg/dL   Calcium 8.8  8.4 - 81.1 mg/dL   Total Protein 6.4  6.0 - 8.3 g/dL   Albumin 3.0 (*) 3.5 - 5.2 g/dL   AST 24  0 - 37 U/L   ALT 26  0 - 53 U/L   Alkaline Phosphatase 121 (*) 39 - 117 U/L   Total Bilirubin 0.3  0.3 - 1.2 mg/dL   GFR calc non Af Amer 61 (*) >90 mL/min   GFR calc Af Amer 70 (*) >90 mL/min   Comment:            The eGFR has been calculated      using the CKD EPI equation.     This calculation has not been     validated in all clinical     situations.     eGFR's persistently     <90 mL/min signify     possible Chronic Kidney Disease.  GLUCOSE, CAPILLARY     Status: None   Collection Time    04/29/13  7:44 AM      Result Value Range   Glucose-Capillary 99  70 - 99 mg/dL   Comment 1 Notify RN    GLUCOSE, CAPILLARY     Status: Abnormal   Collection Time    04/29/13 11:34 AM      Result Value Range   Glucose-Capillary 127 (*) 70 - 99 mg/dL   Comment 1 Notify RN       Eyes: EOM are normal.  Neck: Normal range of motion. Neck supple. No thyromegaly present.  Cardiovascular: Normal rate and regular rhythm.  Pulmonary/Chest: Effort normal and breath sounds normal. No respiratory distress.  Abdominal: Soft. Bowel sounds are normal. He exhibits no distension.  Neurological: He is alert.  Patient is dysarthric and and limited to following simple commands. Poor awareness of deficits and decreased attention  Skin: Skin is warm and dry.  visual fields are intact the confrontational testing, left neglect noted with double simultaneous stimulation  Neuro:  Eyes without evidence of nystagmus  Tone is normal without evidence of spasticity  Cerebellar exam shows no evidence of ataxia on finger nose finger or heel to shin testing  No evidence of trunkal ataxia  Motor strength is 5/5 in  bilateral deltoid, biceps, triceps, finger flexors and extensors, wrist flexors and extensors, hip flexors, knee flexors and extensors, ankle dorsiflexors, plantar flexors, invertors and evertors, toe flexors and extensors  Difficulty following two-step commands. Speech is both nonfluent but also has word substitution   Assessment/Plan: 1. Functional deficits secondary to acute right thrombotic MCA infarct superimposed on bilateral basal ganglia chronic infarcts which require 3+ hours per day of interdisciplinary therapy in a comprehensive inpatient rehab  setting. Physiatrist is providing close team supervision and 24 hour management of active medical problems listed below. Physiatrist and rehab team continue to assess barriers to discharge/monitor patient progress toward functional and medical goals. FIM: FIM - Bathing Bathing Steps Patient Completed: Chest;Right lower leg (including foot);Right Arm;Left Arm;Abdomen;Front perineal area;Buttocks;Right upper leg;Left upper leg;Left lower leg (including foot) Bathing: 4: Steadying assist  FIM - Upper Body Dressing/Undressing Upper body dressing/undressing steps patient completed: Thread/unthread left sleeve of pullover shirt/dress;Thread/unthread right sleeve of pullover shirt/dresss;Put head through opening of pull over shirt/dress;Pull shirt over trunk Upper body dressing/undressing: 5: Set-up assist to: Obtain clothing/put away FIM - Lower Body Dressing/Undressing Lower body dressing/undressing steps patient completed: Thread/unthread right underwear leg;Thread/unthread left underwear leg;Pull underwear up/down;Thread/unthread right pants leg;Thread/unthread left pants leg;Pull pants up/down;Don/Doff right shoe;Don/Doff left shoe;Fasten/unfasten right shoe Lower body dressing/undressing: 4: Min-Patient completed 75 plus % of tasks  FIM - Toileting Toileting steps completed by patient: Performs perineal hygiene Toileting Assistive Devices: Grab bar or rail for support Toileting: 4: Steadying assist  FIM - Diplomatic Services operational officer Devices: Grab bars Toilet Transfers: 4-To toilet/BSC: Min A (steadying Pt. > 75%);4-From toilet/BSC: Min A (steadying Pt. > 75%)  FIM - Bed/Chair Transfer Bed/Chair Transfer Assistive Devices: Arm rests Bed/Chair Transfer: 5: Supine > Sit: Supervision (verbal cues/safety issues)  FIM - Locomotion: Wheelchair Locomotion: Wheelchair: 0: Activity did not occur FIM - Locomotion: Ambulation Locomotion: Ambulation Assistive Devices: Other (comment)  (one person hand held assist) Ambulation/Gait Assistance: 4: Min guard Locomotion: Ambulation: 4: Travels 150 ft or more with minimal assistance (Pt.>75%)  Comprehension Comprehension Mode: Auditory Comprehension: 4-Understands basic 75 - 89% of the time/requires cueing 10 - 24% of the time  Expression Expression Mode: Verbal Expression: 4-Expresses basic 75 - 89% of the time/requires cueing 10 - 24% of the time. Needs helper to occlude trach/needs to repeat words.  Social Interaction Social Interaction: 3-Interacts appropriately 50 - 74% of the time - May be physically or verbally inappropriate.  Problem Solving Problem Solving: 2-Solves basic 25 - 49% of the time - needs direction more than half the time to initiate, plan or complete simple activities  Memory Memory: 3-Recognizes or recalls 50 - 74% of the time/requires cueing 25 - 49% of the time  Medical Problem List and Plan:  1. Thrombotic right MCA infarct , with history of bilateral subcortical infarcts 2. DVT Prophylaxis/Anticoagulation: Subcutaneous Lovenox. Monitor platelet counts any signs of bleeding  3. Pain Management: Tylenol as needed  4. Dysphagia. Dysphagia to thin liquid diet. Monitor for any signs of aspiration and followup speech therapy  5. Mood/depression. Paxil 20 mg daily. Provide emotional support  6. Neuropsych: This patient is not capable of making decisions on his own behalf.  7. Hypertension. Lisinopril 40 mg daily. Monitor with increased activity  8. ?Diabetes mellitus. Question diagnosis Hemoglobin A1c 5.6. Patient is diet controlled.  blood sugars a.c. and at bedtime are normal will D/C 9.  Tobacco abuse, will order nicoderm   LOS (Days) 3 A FACE TO FACE EVALUATION WAS PERFORMED  Erick Colace 05/01/2013, 9:34 AM

## 2013-05-01 NOTE — Progress Notes (Signed)
Occupational Therapy Session Note  Patient Details  Name: Ryan Brady MRN: [id number] Date of Birth: 06-Nov-1945  Today's Date: 05/01/2013 Time: 4132-4401 Time Calculation (min): 30 min  Short Term Goals: Week 1:  OT Short Term Goal 1 (Week 1): Refer to LTGs secondary to expected short LOS  Skilled Therapeutic Interventions/Progress Updates:    Therapy session focused on functional transfers, awareness, and family training/education. Pt's son present upon arrival and pt reported needing to complete toileting task. Ambulated into bathroom with steadying assist. Pt ran into corner of table on L side demonstrating decreased awareness. Required assist to unfasten pants d/t long nails. Son reported pt's goes "to a place" to get nails cut and they were able to cut them last time he was in the hospital. Pt had incontinent episode of BM and required assist to clean for thoroughness. Pt then able to complete threading of BLE into underwear and pant legs then manage around waist with assist to fasten. Nursing tech completed remainder of dressing and toileting tx with pt. While pt completing toilet task, discussed therapy and discharge plans for pt. Son reported he would be moving in with him and his wife into a 3 bedroom apartment with no stairs to enter. Setup will be a tub and they do not have a chair. He informed OT there were no grab bars or counters around toilet and toilet was "sort of low." Son also reported that his own son will be home through mid August to provide assistance when needed.   Therapy Documentation Precautions:  Precautions Precautions: Fall Precaution Comments: L sided weakness (UE >LE) and some mild L sided inattention.  Restrictions Weight Bearing Restrictions: No General:   Vital Signs:   Pain: No c/o pain during therapy session.   See FIM for current functional status  Therapy/Group: Individual Therapy  Daneil Dan 05/01/2013, 1:37 PM

## 2013-05-02 ENCOUNTER — Inpatient Hospital Stay (HOSPITAL_COMMUNITY): Payer: Medicare Other | Admitting: Occupational Therapy

## 2013-05-02 ENCOUNTER — Inpatient Hospital Stay (HOSPITAL_COMMUNITY): Payer: Medicare Other

## 2013-05-02 ENCOUNTER — Inpatient Hospital Stay (HOSPITAL_COMMUNITY): Payer: Medicare Other | Admitting: Physical Therapy

## 2013-05-02 DIAGNOSIS — F329 Major depressive disorder, single episode, unspecified: Secondary | ICD-10-CM

## 2013-05-02 DIAGNOSIS — R066 Hiccough: Secondary | ICD-10-CM

## 2013-05-02 DIAGNOSIS — I6330 Cerebral infarction due to thrombosis of unspecified cerebral artery: Secondary | ICD-10-CM

## 2013-05-02 DIAGNOSIS — E876 Hypokalemia: Secondary | ICD-10-CM

## 2013-05-02 MED ORDER — BACLOFEN 10 MG PO TABS
10.0000 mg | ORAL_TABLET | Freq: Four times a day (QID) | ORAL | Status: DC | PRN
  Administered 2013-05-02 – 2013-05-04 (×2): 10 mg via ORAL
  Filled 2013-05-02 (×5): qty 1

## 2013-05-02 NOTE — Progress Notes (Signed)
Speech Language Pathology Daily Session Note  Patient Details  Name: Ryan Brady MRN: [id number] Date of Birth: 1946/10/14  Today's Date: 05/02/2013 Time: 1130-1145 Time Calculation (min): 15 min  Short Term Goals: Week 1: SLP Short Term Goal 1 (Week 1): Pt will utilize speech intelligbility strategies to increase intelligibility to 75% at the phrase level with Min A verbal cues.  SLP Short Term Goal 2 (Week 1): Pt will demonstrate sustained attention to a functional task for 5 minutes with Min A verbal and question cues for redirection.  SLP Short Term Goal 3 (Week 1): Pt will demonstrate functional problem solving for basic and familiar tasks with Mod A verbal cues.  SLP Short Term Goal 4 (Week 1): Pt will utilize call bell to express wants/needs with Mod A verbal and question cues.  SLP Short Term Goal 5 (Week 1): Pt will identify 2 physical and 2 cognitive deficits with Mod A question and contextual cues.  SLP Short Term Goal 6 (Week 1): Pt will utilize word-finding strategies with Min A verbal and question cues to increase functional communication  Skilled Therapeutic Interventions: Group, co-treatment session with OT addressed dysphagia and self-feeding goals. SLP facilitated session with increased wait time and Min assist verbal cues to problem solve tray set-up. Patient consumed Dys.3 textures and thin liquids via cup with cough x 2 and Mod assist question cues to alternate between bites and sips. Pt also required Min verbal cues to utilize small bites and a slow rate.    FIM:  Comprehension Comprehension Mode: Auditory Comprehension: 4-Understands basic 75 - 89% of the time/requires cueing 10 - 24% of the time Expression Expression Mode: Verbal Expression: 4-Expresses basic 75 - 89% of the time/requires cueing 10 - 24% of the time. Needs helper to occlude trach/needs to repeat words. Social Interaction Social Interaction: 4-Interacts appropriately 75 - 89% of the time - Needs  redirection for appropriate language or to initiate interaction. Problem Solving Problem Solving: 3-Solves basic 50 - 74% of the time/requires cueing 25 - 49% of the time Memory Memory: 4-Recognizes or recalls 75 - 89% of the time/requires cueing 10 - 24% of the time FIM - Eating Eating Activity: 4: Helper checks for pocketed food  Pain Pain Assessment Pain Assessment: No/denies pain  Therapy/Group: Group Therapy  Atari Novick 05/02/2013, 12:52 PM

## 2013-05-02 NOTE — Progress Notes (Signed)
Occupational Therapy Session Note  Patient Details  Name: Ryan Brady MRN: [id number] Date of Birth: 03-08-1946  Today's Date: 05/02/2013 Time: 9147-8295 Time Calculation (min): 30 min  Short Term Goals: Week 1:  OT Short Term Goal 1 (Week 1): Refer to LTGs secondary to expected short LOS  Skilled Therapeutic Interventions/Progress Updates:   Patient seen for co-treatment, group therapy session with SLP present for [employer].  Session emphasis focus included L sided attention, self-feeding, bimanual FMC, functional use of BUEs, and visual scanning to L side.  Patient able to locate all items on/around tray and complete self feeding with setup to open milk container.  Finger nail length interfered with ability to open milk container.  Patient supervision for self feeding.   Therapy Documentation Precautions:  Precautions Precautions: Fall Precaution Comments: L sided weakness (UE >LE) and some mild L sided inattention.  Restrictions Weight Bearing Restrictions: No Vital Signs: Therapy Vitals BP: 152/84 mmHg Pain: Pain Assessment Pain Assessment: No/denies pain  See FIM for current functional status  Therapy/Group: Group Therapy  Eber Hong 05/02/2013, 1:09 PM

## 2013-05-02 NOTE — Progress Notes (Addendum)
Physical Therapy Session Note  Patient Details  Name: Ryan Brady MRN: [id number] Date of Birth: 1946-07-23  Today's Date: 05/02/2013 Time: 0800-0900 Time Calculation (min): 60 min  Short Term Goals: Week 1:  PT Short Term Goal 1 (Week 1): STGs=LTGs  Skilled Therapeutic Interventions/Progress Updates:    Pt was seen bedside in the am, sleeping but arousable. Pt requesting to get dressed prior to participating with therapy. Supine to edge of bed with head of bed slightly elevated with side rail. Pt able to perform with S and increased time. Pt transferred sit to stand from edge of bed multiple times to assist with dressing, pt required S and occasional verbal cues due to decreased safety awareness. Pt able to ambulate around room with S and occasional verbal cues, wide BOS and fluctuating step length noted. Pt required min Ax1 once in bathroom due to tight space and decreased safety awareness. Pt ambulated ~ 200 feet with S and occasional verbal cues for safety, no LOB. Pt ate breakfast while sitting up in bedside chair, pt requires verbal cues to remember speech precautions while eating. Pt left sitting up in bedside chair with call bell within reach. ADDENDUM: No c/o pain.   Therapy Documentation Precautions:  Precautions Precautions: Fall Precaution Comments: L sided weakness (UE >LE) and some mild L sided inattention.  Restrictions Weight Bearing Restrictions: No  Pain: Pain Assessment Pain Assessment: No/denies painLocomotion : Ambulation Ambulation/Gait Assistance: 5: Supervision   See FIM for current functional status  Therapy/Group: Individual Therapy  Rayford Halsted 05/02/2013, 12:06 PM

## 2013-05-02 NOTE — Progress Notes (Signed)
Ryan Brady is a 67 y.o. male 29-Feb-1946 [id number]  Subjective: C/o hiccups. Slept well. Feeling OK.  Objective: Vital signs in last 24 hours: Temp:  [97.9 F (36.6 C)-98 F (36.7 C)] 98 F (36.7 C) (07/12 0554) Pulse Rate:  [78-84] 78 (07/12 0554) Resp:  [15-17] 17 (07/12 0554) BP: (118-129)/(72) 129/72 mmHg (07/12 0554) SpO2:  [99 %-100 %] 99 % (07/12 0554) Weight change:  Last BM Date: 05/01/13  Intake/Output from previous day: 07/11 0701 - 07/12 0700 In: 1080 [P.O.:1080] Out: 2001 [Urine:2000; Stool:1] Last cbgs: CBG (last 3)   Recent Labs  04/29/13 1134  GLUCAP 127*     Physical Exam General: No apparent distress    HEENT: moist mucosa Lungs: Normal effort. Lungs clear to auscultation, no crackles or wheezes. Cardiovascular: Regular rate and rhythm, no edema Musculoskeletal:  No change from before Neurological: No new neurological deficits Wounds: N/A    Skin: clear Alert, cooperative No hiccups at present   Lab Results: BMET    Component Value Date/Time   NA 136 04/29/2013 0600   K 3.4* 04/29/2013 0600   CL 103 04/29/2013 0600   CO2 23 04/29/2013 0600   GLUCOSE 93 04/29/2013 0600   BUN 15 04/29/2013 0600   CREATININE 1.19 04/29/2013 0600   CALCIUM 8.8 04/29/2013 0600   GFRNONAA 61* 04/29/2013 0600   GFRAA 70* 04/29/2013 0600   CBC    Component Value Date/Time   WBC 10.9* 04/29/2013 0600   RBC 4.23 04/29/2013 0600   HGB 12.4* 04/29/2013 0600   HCT 36.7* 04/29/2013 0600   PLT 282 04/29/2013 0600   MCV 86.8 04/29/2013 0600   MCH 29.3 04/29/2013 0600   MCHC 33.8 04/29/2013 0600   RDW 18.9* 04/29/2013 0600   LYMPHSABS 2.7 04/29/2013 0600   MONOABS 1.7* 04/29/2013 0600   EOSABS 0.3 04/29/2013 0600   BASOSABS 0.1 04/29/2013 0600    Studies/Results: No results found.  Medications: I have reviewed the patient's current medications.  Assessment/Plan:   1. Thrombotic right MCA infarct , with history of bilateral subcortical infarcts  2. DVT Prophylaxis/Anticoagulation:  Subcutaneous Lovenox. Monitor platelet counts any signs of bleeding  3. Pain Management: Tylenol as needed  4. Dysphagia. Dysphagia to thin liquid diet. Monitor for any signs of aspiration and followup speech therapy  5. Mood/depression. Paxil 20 mg daily. Provide emotional support  6. Neuropsych: This patient is not capable of making decisions on his own behalf.  7. Hypertension. Lisinopril 40 mg daily. Monitor with increased activity  8. ?Diabetes mellitus. Question diagnosis Hemoglobin A1c 5.6. Patient is diet controlled. blood sugars a.c. and at bedtime are normal will D/C  9. Tobacco abuse, will order nicoderm  10. Hiccups - Baclofen prn      Length of stay, days: 4  Sonda Primes , MD 05/02/2013, 9:08 AM

## 2013-05-02 NOTE — Plan of Care (Signed)
Problem: RH BLADDER ELIMINATION Goal: RH STG MANAGE BLADDER WITH ASSISTANCE STG Manage Bladder With mod Assistance  Outcome: Not Progressing Using condom cath at [facility] D/T incontinence

## 2013-05-02 NOTE — Progress Notes (Signed)
Occupational Therapy Session Note  Patient Details  Name: Ryan Brady MRN: [id number] Date of Birth: 02-21-1946  Today's Date: 05/02/2013 Time: 4098-1191 Time Calculation (min): 50 min  Short Term Goals: Week 1:  OT Short Term Goal 1 (Week 1): Refer to LTGs secondary to expected short LOS  Skilled Therapeutic Interventions: ADL-retraining with emphasis on dynamic sitting/standing balance, sequencing, safety awareness, sustained attentions, and problem-solving.  With verbal cues and setup assist, patient completed bathing, sitting and standing, in walk-in shower without evidence of impaired motor planning or balance.  Thoroughness was inconsistent as patient rejected cues to wash face and hair or feet.      Therapy Documentation Precautions:  Precautions Precautions: Fall Precaution Comments: L sided weakness (UE >LE) and some mild L sided inattention.  Restrictions Weight Bearing Restrictions: No  Pain: Pain Assessment Pain Assessment: No/denies pain  See FIM for current functional status  Therapy/Group: Individual Therapy  Second session: Time: 4782-9562 Time Calculation (min):  54 min  Pain Assessment: No pain  Skilled Therapeutic Interventions: Therapeutic activities with emphasis on sustained attention, reorientation, functional mobility, falls recovery, and car transfers.  Patient able to ambulate with close supervision and without device or LOB during session completing all tasks as directed: car transfers, floor recovery, functional mobility, and w/c management (replacing his cushion), with only one demonstration and verbal cues to initiate task.   During floor recovery patient required use of one straight chair when using his arms as needed to stand from quadriped to knees to full upright, due to irritation to left upper thigh while in quadriped position.   Patient resistive to recommended LE strengthening exercises.  Patient also required redirection and assist with  orientation to room location 2 times during session.  He was unable to sustain attention to task for more than 15 seconds in a moderately distracting environment (rehab gym).    See FIM for current functional status  Therapy/Group: Individual Therapy  Georgeanne Nim 05/02/2013, 10:11 AM

## 2013-05-03 ENCOUNTER — Inpatient Hospital Stay (HOSPITAL_COMMUNITY): Payer: Medicare Other | Admitting: Physical Therapy

## 2013-05-03 NOTE — Progress Notes (Signed)
Ryan Brady is a 67 y.o. male June 14, 1946 [id number]  Subjective: C/o hiccups - not better yet. We increased his Baclofen.. Slept well. Feeling OK.  Objective: Vital signs in last 24 hours: Temp:  [98 F (36.7 C)-98.1 F (36.7 C)] 98.1 F (36.7 C) (07/13 0555) Pulse Rate:  [75-90] 90 (07/13 0555) Resp:  [17-18] 17 (07/13 0555) BP: (135-152)/(71-84) 139/78 mmHg (07/13 0555) SpO2:  [100 %] 100 % (07/13 0555) Weight change:  Last BM Date: 05/01/13  Intake/Output from previous day: 07/12 0701 - 07/13 0700 In: 960 [P.O.:960] Out: 1000 [Urine:1000] Last cbgs: CBG (last 3)  No results found for this basename: GLUCAP,  in the last 72 hours   Physical Exam General: No apparent distress    HEENT: moist mucosa Lungs: Normal effort. Lungs clear to auscultation, no crackles or wheezes. Cardiovascular: Regular rate and rhythm, no edema Musculoskeletal:  No change from before Neurological: No new neurological deficits Wounds: N/A    Skin: clear Alert, cooperative No hiccups at present   Lab Results: BMET    Component Value Date/Time   NA 136 04/29/2013 0600   K 3.4* 04/29/2013 0600   CL 103 04/29/2013 0600   CO2 23 04/29/2013 0600   GLUCOSE 93 04/29/2013 0600   BUN 15 04/29/2013 0600   CREATININE 1.19 04/29/2013 0600   CALCIUM 8.8 04/29/2013 0600   GFRNONAA 61* 04/29/2013 0600   GFRAA 70* 04/29/2013 0600   CBC    Component Value Date/Time   WBC 10.9* 04/29/2013 0600   RBC 4.23 04/29/2013 0600   HGB 12.4* 04/29/2013 0600   HCT 36.7* 04/29/2013 0600   PLT 282 04/29/2013 0600   MCV 86.8 04/29/2013 0600   MCH 29.3 04/29/2013 0600   MCHC 33.8 04/29/2013 0600   RDW 18.9* 04/29/2013 0600   LYMPHSABS 2.7 04/29/2013 0600   MONOABS 1.7* 04/29/2013 0600   EOSABS 0.3 04/29/2013 0600   BASOSABS 0.1 04/29/2013 0600    Studies/Results: No results found.  Medications: I have reviewed the patient's current medications.  Assessment/Plan:   1. Thrombotic right MCA infarct , with history of bilateral subcortical  infarcts  2. DVT Prophylaxis/Anticoagulation: Subcutaneous Lovenox. Monitor platelet counts any signs of bleeding  3. Pain Management: Tylenol as needed  4. Dysphagia. Dysphagia to thin liquid diet. Monitor for any signs of aspiration and followup speech therapy  5. Mood/depression. Paxil 20 mg daily. Provide emotional support  6. Neuropsych: This patient is not capable of making decisions on his own behalf.  7. Hypertension. Lisinopril 40 mg daily. Monitor with increased activity  8. ?Diabetes mellitus. Question diagnosis Hemoglobin A1c 5.6. Patient is diet controlled. blood sugars a.c. and at bedtime are normal will D/C  9. Tobacco abuse, will order nicoderm  10. Hiccups - Baclofen prn. We can try chlorpromazine on Monday if needed      Length of stay, days: 5  Sonda Primes , MD 05/03/2013, 8:44 AM

## 2013-05-03 NOTE — Progress Notes (Signed)
Physical Therapy Session Note  Patient Details  Name: Ryan Brady MRN: [id number] Date of Birth: 1946/03/28  Today's Date: 05/03/2013 Time: 0865-7846 Time Calculation (min): 42 min  Short Term Goals: Week 1:  PT Short Term Goal 1 (Week 1): STGs=LTGs  Skilled Therapeutic Interventions/Progress Updates:    Pt was seen bedside in the am finishing up breakfast. Pt transferred supine to edge of bed with side rail and head of bed slightly elevated with supervision. Pt able to ambulate in and out of bathroom, transfer on and off the toilet with S and occasional verbal cues for safety. Pt ambulated 150 x2 without assistive device and supervision, pt ambulates with wide BOS of support, no LOB noted. In gym, perform LE strengthening and balance activities involving single leg stance to assist with increasing strength and independence with mobility.   Therapy Documentation Precautions:  Precautions Precautions: Fall Precaution Comments: L sided weakness (UE >LE) and some mild L sided inattention.  Restrictions Weight Bearing Restrictions: No Pain: Pain Assessment Pain Assessment: No/denies pain  Locomotion : Ambulation Ambulation/Gait Assistance: 5: Supervision   See FIM for current functional status  Therapy/Group: Individual Therapy  Rayford Halsted 05/03/2013, 12:32 PM

## 2013-05-04 ENCOUNTER — Inpatient Hospital Stay (HOSPITAL_COMMUNITY): Payer: Medicare Other | Admitting: Speech Pathology

## 2013-05-04 ENCOUNTER — Inpatient Hospital Stay (HOSPITAL_COMMUNITY): Payer: Medicare Other | Admitting: *Deleted

## 2013-05-04 ENCOUNTER — Inpatient Hospital Stay (HOSPITAL_COMMUNITY): Payer: Medicare Other

## 2013-05-04 DIAGNOSIS — I6330 Cerebral infarction due to thrombosis of unspecified cerebral artery: Secondary | ICD-10-CM

## 2013-05-04 DIAGNOSIS — I69993 Ataxia following unspecified cerebrovascular disease: Secondary | ICD-10-CM

## 2013-05-04 NOTE — Progress Notes (Signed)
Speech Language Pathology Daily Session Note  Patient Details  Name: Ryan Brady MRN: [id number] Date of Birth: February 01, 1946  Today's Date: 05/04/2013 Time: 0835-0900 Time Calculation (min): 25 min  Short Term Goals: Week 1: SLP Short Term Goal 1 (Week 1): Pt will utilize speech intelligbility strategies to increase intelligibility to 75% at the phrase level with Min A verbal cues.  SLP Short Term Goal 2 (Week 1): Pt will demonstrate sustained attention to a functional task for 5 minutes with Min A verbal and question cues for redirection.  SLP Short Term Goal 3 (Week 1): Pt will demonstrate functional problem solving for basic and familiar tasks with Mod A verbal cues.  SLP Short Term Goal 4 (Week 1): Pt will utilize call bell to express wants/needs with Mod A verbal and question cues.  SLP Short Term Goal 5 (Week 1): Pt will identify 2 physical and 2 cognitive deficits with Mod A question and contextual cues.  SLP Short Term Goal 6 (Week 1): Pt will utilize word-finding strategies with Min A verbal and question cues to increase functional communication  Skilled Therapeutic Interventions: Skilled treatment session focused on addressing dysphagia goals. SLP facilitated session with increased wait time to organize and problem solve tray set up.  Patient appropriately requested help x1 during today's session.  SLP also facilitated session with Supervision level verbal cues to alternate solids and liquids and to consume small bites/sips with intermittent coughs throughout session.      FIM:  Comprehension Comprehension Mode: Auditory Comprehension: 4-Understands basic 75 - 89% of the time/requires cueing 10 - 24% of the time Expression Expression Mode: Verbal Expression: 3-Expresses basic 50 - 74% of the time/requires cueing 25 - 50% of the time. Needs to repeat parts of sentences. Social Interaction Social Interaction: 3-Interacts appropriately 50 - 74% of the time - May be physically or  verbally inappropriate. Problem Solving Problem Solving: 3-Solves basic 50 - 74% of the time/requires cueing 25 - 49% of the time Memory Memory: 2-Recognizes or recalls 25 - 49% of the time/requires cueing 51 - 75% of the time FIM - Eating Eating Activity: 4: Helper checks for pocketed food  Pain Pain Assessment Pain Assessment: No/denies pain Pain Score: 0-No pain  Therapy/Group: Individual Therapy  Charlane Ferretti., CCC-SLP [phone]  Jerin Franzel 05/04/2013, 1:12 PM

## 2013-05-04 NOTE — Progress Notes (Signed)
Patient ID: Ryan Brady, male   DOB: 1946-08-24, 67 y.o.   MRN: [id number] Subjective/Complaints:   Review of Systems  No new complaints. Sitting comfortably in chair. A 12 point review of systems has been performed and if not noted above is otherwise negative.  Objective: Vital Signs: Blood pressure 126/70, pulse 76, temperature 98 F (36.7 C), temperature source Oral, resp. rate 18, height 5\' 9"  (1.753 m), weight 79.47 kg (175 lb 3.2 oz), SpO2 100.00%. No results found. No results found for this or any previous visit (from the past 72 hour(s)).   Eyes: EOM are normal.  Neck: Normal range of motion. Neck supple. No thyromegaly present.  Cardiovascular: Normal rate and regular rhythm.  Pulmonary/Chest: Effort normal and breath sounds normal. No respiratory distress.  Abdominal: Soft. Bowel sounds are normal. He exhibits no distension.  Neurological: He is alert.  Patient is dysarthric and and limited to following simple commands. Poor awareness of deficits and decreased attention  Skin: Skin is warm and dry.  visual fields are intact the confrontational testing, left neglect noted with double simultaneous stimulation  Neuro:  Eyes without evidence of nystagmus  Tone is normal without evidence of spasticity  Cerebellar exam shows no evidence of ataxia on finger nose finger or heel to shin testing  No evidence of trunkal ataxia  Motor strength is 5/5 in bilateral deltoid, biceps, triceps, finger flexors and extensors, wrist flexors and extensors, hip flexors, knee flexors and extensors, ankle dorsiflexors, plantar flexors, invertors and evertors, toe flexors and extensors  Difficulty following two-step commands. Speech is both nonfluent but also has word substitution   Assessment/Plan: 1. Functional deficits secondary to acute right thrombotic MCA infarct superimposed on bilateral basal ganglia chronic infarcts which require 3+ hours per day of interdisciplinary therapy in a  comprehensive inpatient rehab setting. Physiatrist is providing close team supervision and 24 hour management of active medical problems listed below. Physiatrist and rehab team continue to assess barriers to discharge/monitor patient progress toward functional and medical goals. FIM: FIM - Bathing Bathing Steps Patient Completed: Chest;Right Arm;Left Arm;Abdomen;Front perineal area;Buttocks;Right upper leg;Left upper leg;Right lower leg (including foot);Left lower leg (including foot) Bathing: 5: Supervision: Safety issues/verbal cues  FIM - Upper Body Dressing/Undressing Upper body dressing/undressing steps patient completed: Thread/unthread right sleeve of pullover shirt/dresss;Thread/unthread left sleeve of pullover shirt/dress;Put head through opening of pull over shirt/dress;Pull shirt over trunk Upper body dressing/undressing: 5: Set-up assist to: Obtain clothing/put away FIM - Lower Body Dressing/Undressing Lower body dressing/undressing steps patient completed: Thread/unthread right underwear leg;Thread/unthread left underwear leg;Pull underwear up/down;Thread/unthread right pants leg;Pull pants up/down;Don/Doff right shoe;Fasten/unfasten right shoe Lower body dressing/undressing: 3: Mod-Patient completed 50-74% of tasks  FIM - Toileting Toileting steps completed by patient: Performs perineal hygiene;Adjust clothing after toileting;Adjust clothing prior to toileting Toileting Assistive Devices: Grab bar or rail for support Toileting: 4: Steadying assist  FIM - Diplomatic Services operational officer Devices: Grab bars Toilet Transfers: 5-To toilet/BSC: Supervision (verbal cues/safety issues);5-From toilet/BSC: Supervision (verbal cues/safety issues)  FIM - Press photographer Assistive Devices: Arm rests Bed/Chair Transfer: 5: Supine > Sit: Supervision (verbal cues/safety issues);5: Bed > Chair or W/C: Supervision (verbal cues/safety issues)  FIM -  Locomotion: Wheelchair Locomotion: Wheelchair: 0: Activity did not occur FIM - Locomotion: Ambulation Locomotion: Ambulation Assistive Devices: Other (comment) (no assistive device) Ambulation/Gait Assistance: 5: Supervision Locomotion: Ambulation: 5: Travels 150 ft or more with supervision/safety issues  Comprehension Comprehension Mode: Auditory Comprehension: 4-Understands basic 75 - 89% of the time/requires cueing 10 -  24% of the time  Expression Expression Mode: Verbal Expression: 4-Expresses basic 75 - 89% of the time/requires cueing 10 - 24% of the time. Needs helper to occlude trach/needs to repeat words.  Social Interaction Social Interaction: 4-Interacts appropriately 75 - 89% of the time - Needs redirection for appropriate language or to initiate interaction.  Problem Solving Problem Solving: 3-Solves basic 50 - 74% of the time/requires cueing 25 - 49% of the time  Memory Memory: 4-Recognizes or recalls 75 - 89% of the time/requires cueing 10 - 24% of the time  Medical Problem List and Plan:  1. Thrombotic right MCA infarct , with history of bilateral subcortical infarcts 2. DVT Prophylaxis/Anticoagulation: Subcutaneous Lovenox. Monitor platelet counts any signs of bleeding  3. Pain Management: Tylenol as needed  4. Dysphagia. Dysphagia to thin liquid diet. Monitor for any signs of aspiration and followup speech therapy  5. Mood/depression. Paxil 20 mg daily. Provide emotional support  6. Neuropsych: This patient is not capable of making decisions on his own behalf.  7. Hypertension. Lisinopril 40 mg daily. Monitor with increased activity  8. ?Diabetes mellitus. Question diagnosis. cbg checks have been stopped 9.  Tobacco abuse, will order nicoderm   LOS (Days) 6 A FACE TO FACE EVALUATION WAS PERFORMED  SWARTZ,ZACHARY T 05/04/2013, 9:24 AM

## 2013-05-04 NOTE — Progress Notes (Signed)
Speech Language Pathology Daily Session Note  Patient Details  Name: Ryan Brady MRN: [id number] Date of Birth: [birth year]  Today's Date: 05/04/2013 Time: 1130-1200 Time Calculation (min): 30 min  Short Term Goals: Week 1: SLP Short Term Goal 1 (Week 1): Pt will utilize speech intelligbility strategies to increase intelligibility to 75% at the phrase level with Min A verbal cues.  SLP Short Term Goal 2 (Week 1): Pt will demonstrate sustained attention to a functional task for 5 minutes with Min A verbal and question cues for redirection.  SLP Short Term Goal 3 (Week 1): Pt will demonstrate functional problem solving for basic and familiar tasks with Mod A verbal cues.  SLP Short Term Goal 4 (Week 1): Pt will utilize call bell to express wants/needs with Mod A verbal and question cues.  SLP Short Term Goal 5 (Week 1): Pt will identify 2 physical and 2 cognitive deficits with Mod A question and contextual cues.  SLP Short Term Goal 6 (Week 1): Pt will utilize word-finding strategies with Min A verbal and question cues to increase functional communication  Skilled Therapeutic Interventions: Group, co-treatment session with OT addressed dysphagia and self-feeding goals. SLP facilitated session with increased wait time to problem solve tray set-up. Patient consumed Dys.3 textures and thin liquids via cup with intermittent cough, which SLP suspects was a result of prolonged oral phase and premature spillage as a result of decreased oral containment.  SLP attempted to cue for smaller single bites; however, patient declined.  Given poor safety awareness it is recommended that full supervision be continued at this time.   FIM:  Comprehension Comprehension Mode: Auditory Comprehension: 4-Understands basic 75 - 89% of the time/requires cueing 10 - 24% of the time Expression Expression Mode: Verbal Expression: 3-Expresses basic 50 - 74% of the time/requires cueing 25 - 50% of the time. Needs to repeat  parts of sentences. Social Interaction Social Interaction: 3-Interacts appropriately 50 - 74% of the time - May be physically or verbally inappropriate. Problem Solving Problem Solving: 3-Solves basic 50 - 74% of the time/requires cueing 25 - 49% of the time Memory Memory: 2-Recognizes or recalls 25 - 49% of the time/requires cueing 51 - 75% of the time FIM - Eating Eating Activity: 4: Helper checks for pocketed food  Pain Pain Assessment Pain Assessment: No/denies pain Pain Score: 0-No pain  Therapy/Group: Group Therapy  Charlane Ferretti., CCC-SLP [phone]  Ryan Brady 05/04/2013, 1:15 PM

## 2013-05-04 NOTE — Progress Notes (Signed)
Occupational Therapy Note  Patient Details  Name: Ryan Brady MRN: [id number] Date of Birth: [birth year] Today's Date: 05/04/2013    Time: 1200-1225 (cotx with Speech Therapy-total time [phone]) Pt denies pain Group therapy  Pt participated in self feeding group with focus on task initiation, attention to task, and swallowing strategies.  Pt required min verbal cues for portion control and rate.  Pt was able to open all containers and did not require verbal cues of attention.  Pt requires extra time to complete all tasks and is resistant to receiving assistance. Lavone Neri [facility] 05/04/2013, 3:25 PM

## 2013-05-04 NOTE — Progress Notes (Signed)
Physical Therapy Session Note  Patient Details  Name: Ryan Brady MRN: [id number] Date of Birth: [birth year]  Today's Date: 05/04/2013 Time: [phone] and 1400-1430 Time Calculation (min): 58 min and 30 min  Short Term Goals: Week 1:  PT Short Term Goal 1 (Week 1): STGs=LTGs  Skilled Therapeutic Interventions/Progress Updates:    AM Session: Patient received sitting in recliner. Patient with requests to brush hair and demonstrates perseveration with doing so. Requires verbal cues to finish activity. Patient with requests to don shoes and when begins to do so, attempts to communicate something to therapist, but demonstrates difficulty with word finding. Patient continues to point at something across room and when cued to state what he needs, is unable to do so. When therapist picks up shoe horn, patient states "that's it." Patient dons shoes sitting in recliner with supervision, requires total assist to tie shoes. Patient with complaints that brief is to loose. Patient ambulates to bathroom without AD and supervision and stands with supervision while new brief donned. Patient reports he needs to urinate. Is able to stand with supervision to do so and stand at sink to wash hands with supervision.   Remainder of session focused on gait training, attention to the L, and dynamic balance with gait; see details below. Patient completed obstacle course x2 consisting of 2 bolsters to step over, 3 cones to weave in/out of, foam pad to step onto then over, and 1 six inch step to negotiate. Patient requires supervision with intermittent instances of min assist for mild LOB (once when weaving in/out of cones secondary to scissoring/too narrow BOS and when negotiating 6 inch step). Discussion with patient throughout session about concerns for going home/things to practice and patient continue to demonstrate poor insight into deficits and decreased awareness. When asked to negotiate a set of stairs, patient refused  and became angry with request, stating "I don't ever do stairs", but reports it's because he "doesn't want to." Patient without complaints when negotiating step without handrails during obstacle course. Patient returned to room and left sitting in recliner with seatbelt donned and all needs within reach.  PM Session: Patient received sitting in recliner. Session focused on gait training in community environment and wayfinding throughout hospital. Patient instructed in gait training >300' without AD and with supervision throughout unit, using signs to navigate to lobby. Patient requires max verbal and visual cues to locate signs and then find locations appropriately. After max cues, patient continues to require cues to maintain attention to task. Patient without any overt LOB and able to negotiate busy halls, on/off elevator, throughout lobby, to restroom in lobby, and within restroom after using toilet with supervision. Patient able to navigate back to elevators, then 4th floor, then to his room from memory, not with use of signs for wayfinding.  Patient returned to room and left sitting in recliner with all needs within reach.  Therapy Documentation Precautions:  Precautions Precautions: Fall Precaution Comments: L sided weakness (UE >LE) and some mild L sided inattention.  Restrictions Weight Bearing Restrictions: No Pain: Pain Assessment Pain Assessment: No/denies pain Pain Score: 0-No pain Locomotion : Ambulation Ambulation: Yes Ambulation/Gait Assistance: 5: Supervision Ambulation Distance (Feet): 200 Feet Assistive device: None Ambulation/Gait Assistance Details: Visual cues/gestures for precautions/safety;Verbal cues for precautions/safety Ambulation/Gait Assistance Details: Patient performed gait training 200' x3 in busy hallways and cued to read room numbers on L side of hall. No overt LOB. Gait Gait: Yes Gait Pattern: Wide base of support;Step-through pattern;Decreased hip/knee  flexion - left;Decreased  hip/knee flexion - right;Decreased trunk rotation High Level Ambulation High Level Ambulation: Side stepping;Head turns Side Stepping: x30' in each direction with supervision; no overt LOB Head Turns: x150' with alternating horizontal and vertical head turns with supervision; no overt LOB, but decrease in gait speed with head turns. Patient also requires max verbal cues to maintain head turns during ambulation Stairs / Additional Locomotion Stairs: No Curb: 4: Min assist (x2 without AD) Wheelchair Mobility Wheelchair Mobility: No   See FIM for current functional status  Therapy/Group: Individual Therapy  Chipper Herb. Krist Rosenboom, PT, DPT  05/04/2013, 10:17 AM

## 2013-05-04 NOTE — Progress Notes (Signed)
Occupational Therapy Session Note  Patient Details  Name: Ryan Brady MRN: [id number] Date of Birth: 01-Jan-1946  Today's Date: 05/04/2013 Time: 1030-1130 Time Calculation (min): 60 min  Short Term Goals: Week 1:  OT Short Term Goal 1 (Week 1): Refer to LTGs secondary to expected short LOS  Skilled Therapeutic Interventions/Progress Updates:    Therapy session focused on ADL retraining, standing balance, functional transfers, and L awareness. Engaged in bathing task and tub transfer and walk-in shower transfer with supervision and no lob. Pt completed drying and rinsing of body while standing. Once in room pt retrieved clothing from lower drawer with increased time. Returned to chair and OT placed clothing on L side. Pt immediately turned head to retrieve clothing and able to complete dressing with min assist to fasten pants only secondary to pt's long finger nails. Pt required increased time to tie shoes and declined propping foot on trash can to assist with this becoming slightly agitated that OT had mention this technique. Completed oral care in standing requiring cue to apply toothpaste. Ambulated from room to ADL apartment with supervision and demonstrated R gaze. Little awareness of other staff members on L side after cued to attend to them. Pt with no shift in eyes past midline to the L and has decreased awareness of need to turn head. Completed tub transfer with TTB with supervision. Ambulated to [employer] with supervision cues to attend to L. Pt was also very distracted by other pt's and staff members when leaving room.   Therapy Documentation Precautions:  Precautions Precautions: Fall Precaution Comments: L sided weakness (UE >LE) and some mild L sided inattention.  Restrictions Weight Bearing Restrictions: No General:   Vital Signs:   Pain: No c/o pain during therapy session.   See FIM for current functional status  Therapy/Group: Individual Therapy  Daneil Dan 05/04/2013, 12:22 PM

## 2013-05-05 ENCOUNTER — Inpatient Hospital Stay (HOSPITAL_COMMUNITY): Payer: Medicare Other | Admitting: Speech Pathology

## 2013-05-05 ENCOUNTER — Inpatient Hospital Stay (HOSPITAL_COMMUNITY): Payer: Medicare Other | Admitting: *Deleted

## 2013-05-05 ENCOUNTER — Inpatient Hospital Stay (HOSPITAL_COMMUNITY): Payer: Medicare Other

## 2013-05-05 LAB — CREATININE, SERUM: GFR calc non Af Amer: 60 mL/min — ABNORMAL LOW (ref >90)

## 2013-05-05 NOTE — Progress Notes (Addendum)
Occupational Therapy Note  Patient Details  Name: Ryan Brady MRN: [id number] Date of Birth: 06-22-1946 Today's Date: 05/05/2013  Time: 4098-1191 (cotx with Speech Therapy-total time 1130-1210) Pt denies pain Group therapy  Pt participated in self feeding group with focus on task initiation, attention to task, and swallowing strategies.  Pt required min verbal cues for portion control and rate.  Pt was able to open all containers and did not require verbal cues of attention.  Pt requires extra time to complete all tasks but requires no assistance.     Lavone Neri [facility] 05/05/2013, 1:57 PM

## 2013-05-05 NOTE — Progress Notes (Signed)
Patient ID: Ryan Brady, male   DOB: 1946/04/19, 67 y.o.   MRN: [id number] Subjective/Complaints:   Review of Systems  No new problems. Just awakening.  A 12 point review of systems has been performed and if not noted above is otherwise negative.  Objective: Vital Signs: Blood pressure 139/87, pulse 79, temperature 97.7 F (36.5 C), temperature source Oral, resp. rate 18, height 5\' 9"  (1.753 m), weight 79.47 kg (175 lb 3.2 oz), SpO2 100.00%. No results found. Results for orders placed during the hospital encounter of 04/28/13 (from the past 72 hour(s))  CREATININE, SERUM     Status: Abnormal   Collection Time    05/05/13  6:10 AM      Result Value Range   Creatinine, Ser 1.20  0.50 - 1.35 mg/dL   GFR calc non Af Amer 60 (*) >90 mL/min   GFR calc Af Amer 69 (*) >90 mL/min   Comment:            The eGFR has been calculated     using the CKD EPI equation.     This calculation has not been     validated in all clinical     situations.     eGFR's persistently     <90 mL/min signify     possible Chronic Kidney Disease.     Eyes: EOM are normal.  Neck: Normal range of motion. Neck supple. No thyromegaly present.  Cardiovascular: Normal rate and regular rhythm.  Pulmonary/Chest: Effort normal and breath sounds normal. No respiratory distress.  Abdominal: Soft. Bowel sounds are normal. He exhibits no distension.  Neurological: He is alert.  Patient is dysarthric decreased awareness and attention Skin: Skin is warm and dry.  visual fields are intact the confrontational testing, left neglect noted with double simultaneous stimulation  Neuro:  Eyes without evidence of nystagmus  Tone is normal without evidence of spasticity  Cerebellar exam shows no evidence of ataxia on finger nose finger or heel to shin testing  No evidence of trunkal ataxia  Motor strength is 5/5 in bilateral deltoid, biceps, triceps, finger flexors and extensors, wrist flexors and extensors, hip flexors, knee  flexors and extensors, ankle dorsiflexors, plantar flexors, invertors and evertors, toe flexors and extensors  Difficulty following two-step commands.     Assessment/Plan: 1. Functional deficits secondary to acute right thrombotic MCA infarct superimposed on bilateral basal ganglia chronic infarcts which require 3+ hours per day of interdisciplinary therapy in a comprehensive inpatient rehab setting. Physiatrist is providing close team supervision and 24 hour management of active medical problems listed below. Physiatrist and rehab team continue to assess barriers to discharge/monitor patient progress toward functional and medical goals. FIM: FIM - Bathing Bathing Steps Patient Completed: Chest;Right Arm;Left Arm;Abdomen;Front perineal area;Buttocks;Right upper leg;Left upper leg;Right lower leg (including foot);Left lower leg (including foot) Bathing: 5: Supervision: Safety issues/verbal cues  FIM - Upper Body Dressing/Undressing Upper body dressing/undressing steps patient completed: Thread/unthread right sleeve of pullover shirt/dresss;Thread/unthread left sleeve of pullover shirt/dress;Put head through opening of pull over shirt/dress;Pull shirt over trunk Upper body dressing/undressing: 5: Supervision: Safety issues/verbal cues FIM - Lower Body Dressing/Undressing Lower body dressing/undressing steps patient completed: Thread/unthread right underwear leg;Thread/unthread left underwear leg;Pull underwear up/down;Thread/unthread right pants leg;Pull pants up/down;Don/Doff right shoe;Fasten/unfasten right shoe;Thread/unthread left pants leg;Don/Doff left shoe;Fasten/unfasten left shoe Lower body dressing/undressing: 4: Min-Patient completed 75 plus % of tasks  FIM - Toileting Toileting steps completed by patient: Performs perineal hygiene;Adjust clothing after toileting;Adjust clothing prior to toileting Toileting Assistive Devices:  Grab bar or rail for support Toileting: 4: Steadying  assist  FIM - Diplomatic Services operational officer Devices: Grab bars Toilet Transfers: 5-To toilet/BSC: Supervision (verbal cues/safety issues);5-From toilet/BSC: Supervision (verbal cues/safety issues)  FIM - Press photographer Assistive Devices: Arm rests Bed/Chair Transfer: 5: Bed > Chair or W/C: Supervision (verbal cues/safety issues);5: Chair or W/C > Bed: Supervision (verbal cues/safety issues)  FIM - Locomotion: Wheelchair Locomotion: Wheelchair: 0: Activity did not occur FIM - Locomotion: Ambulation Locomotion: Ambulation Assistive Devices: Other (comment) (no assistive device) Ambulation/Gait Assistance: 5: Supervision Locomotion: Ambulation: 5: Travels 150 ft or more with supervision/safety issues  Comprehension Comprehension Mode: Auditory Comprehension: 4-Understands basic 75 - 89% of the time/requires cueing 10 - 24% of the time  Expression Expression Mode: Verbal Expression: 3-Expresses basic 50 - 74% of the time/requires cueing 25 - 50% of the time. Needs to repeat parts of sentences.  Social Interaction Social Interaction: 3-Interacts appropriately 50 - 74% of the time - May be physically or verbally inappropriate.  Problem Solving Problem Solving: 3-Solves basic 50 - 74% of the time/requires cueing 25 - 49% of the time  Memory Memory: 2-Recognizes or recalls 25 - 49% of the time/requires cueing 51 - 75% of the time  Medical Problem List and Plan:  1. Thrombotic right MCA infarct , with history of bilateral subcortical infarcts 2. DVT Prophylaxis/Anticoagulation: Subcutaneous Lovenox. Monitor platelet counts any signs of bleeding  3. Pain Management: Tylenol as needed  4. Dysphagia. Dysphagia to thin liquid diet. Monitor for any signs of aspiration and followup speech therapy  5. Mood/depression. Paxil 20 mg daily. Provide emotional support  6. Neuropsych: This patient is not capable of making decisions on his own behalf.  7.  Hypertension. Lisinopril 40 mg daily. Monitor with increased activity  8. ?Diabetes mellitus. Question diagnosis. cbg checks have been stopped 9.  Tobacco abuse, will order nicoderm   LOS (Days) 7 A FACE TO FACE EVALUATION WAS PERFORMED  SWARTZ,ZACHARY T 05/05/2013, 9:12 AM

## 2013-05-05 NOTE — Progress Notes (Signed)
Physical Therapy Session Note  Patient Details  Name: Ryan Brady MRN: [id number] Date of Birth: 05-10-1946  Today's Date: 05/05/2013 Time: 0930-1030 and 1400-1430 Time Calculation (min): 60 min and 30 min  Short Term Goals: Week 1:  PT Short Term Goal 1 (Week 1): STGs=LTGs  Skilled Therapeutic Interventions/Progress Updates:   AM Session: Patient received sitting in recliner. Patient initially refusing therapy, then states he wants to get dressed. Attempted to have patient stand and walk to dresser to pick out clothes, but patient refuses and becomes slightly irritated stating, "You do it." Patient given bag of clothes and he states they are dirty. Encouraged patient to bring clothes to laundry room to wash them and he states he does not want to do that and wants to put on clean pants. After much discussion (patient appearing confused), patient understands that he does not have clean pants and must do laundry in order to have clean pants to wear. Patient with requests to put shoes on. Patient doffs non-skid socks, dons new socks, and uses shoe horn to don shoes. Patient requires 20 minutes to do so and refuses assist from therapist when asked if he needs help. Patient then agreeable to take clothes to laundry. Patient able to ambulate to laundry room holding bag of clothes without any overt LOB, but requires max verbal cues to find laundry room.   Patient completed car transfer to SUV height (patient reports his grandson drives SUV) with supervision. Discussion with patient about falls risk at home and indications vs. Contraindications for attempting floor transfer or calling EMS after a fall. Patient unable to recall indications for calling EMS without verbal cues. Patient performs floor transfer with supervision: standing>standing with B UE on mat>half kneeling with B UE on mat>tall kneeling with B UE on mat>L side sitting>supine>L side sitting>tall kneeling with B UE on mat>half kneeling with B UE  on mat>standing.  Patient returned to room and left seated in recliner with seatbelt donned and all needs within reach.  PM Session: Patient received sitting in recliner. Patient ambulated room>laundry room and back to retrieve laundry (approx 120'). Patient carried bag of clean laundry back to room without overt LOB, but does demonstrate increased lateral sway. Patient able to retrieve all laundry out of low, front-loading dryer without LOB and supervision. Upon returning to room, patient stood (with intermittent sitting rest breaks to fold clean laundry. Patient with requests to use bathroom, ambulates to bathroom and performs all aspects of toileting with supervision. Patient returns to folding laundry and returns all clean clothes to drawers. Patient left seated in recliner with seatbelt donned and all needs within reach.    Therapy Documentation Precautions:  Precautions Precautions: Fall Precaution Comments: L inattention Restrictions Weight Bearing Restrictions: No Pain: Pain Assessment Pain Assessment: No/denies pain Pain Score: 0-No pain Locomotion : Ambulation Ambulation: Yes Ambulation/Gait Assistance: 5: Supervision Ambulation Distance (Feet): 300 Feet Assistive device: None Ambulation/Gait Assistance Details: Visual cues/gestures for precautions/safety;Verbal cues for precautions/safety Ambulation/Gait Assistance Details: Patient performed gait training >300' x1 and multiple bouts of 175-200' in busy hallways, requiring patient to negotiate people and objects. Patient continues to require cues to attend to the L, but demonstrates improvements scanning L during ambulation. Gait Gait: Yes Gait Pattern: Wide base of support;Step-through pattern;Decreased hip/knee flexion - left;Decreased hip/knee flexion - right;Decreased trunk rotation   See FIM for current functional status  Therapy/Group: Individual Therapy  Ryan Brady. Ryan Brady, PT, DPT  05/05/2013, 12:07  PM

## 2013-05-05 NOTE — Progress Notes (Addendum)
Speech Language Pathology Daily Session Note  Patient Details  Name: Ryan Brady MRN: [id number] Date of Birth: [birth year]  Today's Date: 05/05/2013 Time: 1130-1145 Time Calculation (min): 15 min  Short Term Goals: Week 2: SLP Short Term Goal 1 (Week 2): Pt will demonstrate selective attention to a functional task for 5 minutes with Min A verbal and question cues for redirection.  SLP Short Term Goal 2 (Week 2): Pt will demonstrate functional problem solving for basic and familiar tasks with Min A verbal cues.  SLP Short Term Goal 3 (Week 2): Pt will utilize call bell to express wants/needs with Mod A verbal and question cues.  SLP Short Term Goal 4 (Week 2): Pt will identify 2 physical and 2 cognitive deficits with Mod A question and contextual cues.   Skilled Therapeutic Interventions: Group, co-treatment session with OT addressed cognition and self-feeding goals. SLP facilitated session with increased wait time to problem solve tray set-up and initiate self-feeding.  Patient consumed Dys.3 textures and thin liquids with no cues from therapists and cough x1 throughout meal.  Recommend upgrade to intermittent supervision.     FIM:  Comprehension Comprehension Mode: Auditory Comprehension: 4-Understands basic 75 - 89% of the time/requires cueing 10 - 24% of the time Expression Expression Mode: Verbal Expression: 3-Expresses basic 50 - 74% of the time/requires cueing 25 - 50% of the time. Needs to repeat parts of sentences. Social Interaction Social Interaction: 3-Interacts appropriately 50 - 74% of the time - May be physically or verbally inappropriate. Problem Solving Problem Solving: 3-Solves basic 50 - 74% of the time/requires cueing 25 - 49% of the time Memory Memory: 2-Recognizes or recalls 25 - 49% of the time/requires cueing 51 - 75% of the time FIM - Eating Eating Activity: 5: Set-up assist for cut food  Pain Pain Assessment Pain Assessment: No/denies pain Pain Score:  0-No pain  Therapy/Group: Group Therapy  Charlane Ferretti., CCC-SLP [phone]  [name] 05/05/2013, 12:58 PM

## 2013-05-05 NOTE — Progress Notes (Signed)
Occupational Therapy Session Note  Patient Details  Name: Ryan Brady MRN: [id number] Date of Birth: [birth year]  Today's Date: 05/05/2013 Time: 1031-1129 Time Calculation (min): 58 min  Short Term Goals: Week 1:  OT Short Term Goal 1 (Week 1): Refer to LTGs secondary to expected short LOS  Skilled Therapeutic Interventions/Progress Updates:    Pt performed shower and dressing this session.  Overall supervision for gathering shirt but therapist assisted with setup of other clothing.  Showered in standing with close supervision and occasional use of grab bar.  Pt needed assistance with donning TEDs but was able to donn his shoes and tie them with increased time.  He did need assist to button his pants however.  Pt stood to perform grooming tasks as well with min questioning cues to initate.  Pt with flat affect during session.  Unable to tell therapist about the last fall he had at home or what he was doing when he fell.  Not oriented to place but knew it was a hospital and also needed choices to choose the correct city.  Did demonstrate some awareness of his cognitive deficits by stating that since the stroke he can't remember as well.    Therapy Documentation Precautions:  Precautions Precautions: Fall Precaution Comments: L inattention Restrictions Weight Bearing Restrictions: No  Pain: Pain Assessment Pain Assessment: No/denies pain Pain Score: 0-No pain ADL: See FIM for current functional status  Therapy/Group: Individual Therapy  Stephonie Wilcoxen OTR/L 05/05/2013, 12:15 PM

## 2013-05-05 NOTE — Progress Notes (Addendum)
Speech Language Pathology Weekly Progress Note  Patient Details  Name: Ryan Brady MRN: [id number] Date of Birth: 1946-08-24  Today's Date: 05/05/2013  Short Term Goals: Week 1: SLP Short Term Goal 1 (Week 1): Pt will utilize speech intelligbility strategies to increase intelligibility to 75% at the phrase level with Min A verbal cues.  SLP Short Term Goal 1 - Progress (Week 1): Met SLP Short Term Goal 2 (Week 1): Pt will demonstrate sustained attention to a functional task for 5 minutes with Min A verbal and question cues for redirection.  SLP Short Term Goal 2 - Progress (Week 1): Met SLP Short Term Goal 3 (Week 1): Pt will demonstrate functional problem solving for basic and familiar tasks with Mod A verbal cues.  SLP Short Term Goal 3 - Progress (Week 1): Met SLP Short Term Goal 4 (Week 1): Pt will utilize call bell to express wants/needs with Mod A verbal and question cues.  SLP Short Term Goal 4 - Progress (Week 1): Progressing toward goal SLP Short Term Goal 5 (Week 1): Pt will identify 2 physical and 2 cognitive deficits with Mod A question and contextual cues.  SLP Short Term Goal 5 - Progress (Week 1): Progressing toward goal SLP Short Term Goal 6 (Week 1): Pt will utilize word-finding strategies with Min A verbal and question cues to increase functional communication SLP Short Term Goal 6 - Progress (Week 1): Met Week 2: SLP Short Term Goal 1 (Week 2): Pt will demonstrate selective attention to a functional task for 5 minutes with Min A verbal and question cues for redirection.  SLP Short Term Goal 2 (Week 2): Pt will demonstrate functional problem solving for basic and familiar tasks with Min A verbal cues.  SLP Short Term Goal 3 (Week 2): Pt will utilize call bell to express wants/needs with Mod A verbal and question cues.  SLP Short Term Goal 4 (Week 2): Pt will identify 2 physical and 2 cognitive deficits with Mod A question and contextual cues.   Weekly Progress  Updates: Patient met 4 out of 6 short term goals this reporting period due to gains in sustained attention, increased initiation of verbal expression with increased speech intelligibility and word finding with increased wait time as well as ability to solve basic, familiar problems. Patient is able to verbally state how to use call bell but does not utilize it functionally in the moment and he has also made minimal gains in awareness of deficits which impacts his ability to make safe decisions while completing self-care tasks.  As a result, it is recommended that this patient continue to receive skilled SLP services to address these deficits and reduce burden of care prior to discharge home with son who will provide 24/7 supervision.   SLP Intensity: Minumum of 1-2 x/day, 30 to 90 minutes SLP Frequency: 5 out of 7 days SLP Duration/Estimated Length of Stay: 7/18 SLP Treatment/Interventions: Cognitive remediation/compensation;Cueing hierarchy;Dysphagia/aspiration precaution training;Functional tasks;Internal/external aids;Patient/family education;Therapeutic Activities;Speech/Language facilitation;Environmental controls     Speech Language Pathology Daily Session Note  Patient Details  Name: Ryan Brady MRN: [id number] Date of Birth: 06/25/1946  Today's Date: 05/05/2013 Time: 0830-0900 Time Calculation (min): 30 min  Short Term Goals: Week 2: SLP Short Term Goal 1 (Week 2): Pt will demonstrate selective attention to a functional task for 5 minutes with Min A verbal and question cues for redirection.  SLP Short Term Goal 2 (Week 2): Pt will demonstrate functional problem solving for basic and familiar tasks with Min  A verbal cues.  SLP Short Term Goal 3 (Week 2): Pt will utilize call bell to express wants/needs with Mod A verbal and question cues.  SLP Short Term Goal 4 (Week 2): Pt will identify 2 physical and 2 cognitive deficits with Mod A question and contextual cues.   Skilled  Therapeutic Interventions: Skilled treatment session focused on addressing dysphagia goals.  SLP facilitated session with breakfast tray of Dys.3 textures and thin liquids.  SLP education patient regarding need for set up if he wants staff to intermittently supervise p.o.  Patient initially resisted but with encouragement agreed to allow SLP to cut up fruit into bite size pieces.  All other set up was done by patient and SLP refrained from cuing to assess for upgrade to intermittent supervision.  Patient demonstrated cough x1 throughout meal while alternating attention between meal and conversation with SLP.  Patient was intelligible and required increased wait time to word find.  At end of session Nurse Tech made aware of trial with intermittent supervision with the rest of breakfast.  SLP will also assess for intermittent supervision with lunch prior to initiating new order.     FIM:  Comprehension Comprehension Mode: Auditory Comprehension: 4-Understands basic 75 - 89% of the time/requires cueing 10 - 24% of the time Expression Expression Mode: Verbal Expression: 3-Expresses basic 50 - 74% of the time/requires cueing 25 - 50% of the time. Needs to repeat parts of sentences. Social Interaction Social Interaction: 3-Interacts appropriately 50 - 74% of the time - May be physically or verbally inappropriate. Problem Solving Problem Solving: 3-Solves basic 50 - 74% of the time/requires cueing 25 - 49% of the time Memory Memory: 2-Recognizes or recalls 25 - 49% of the time/requires cueing 51 - 75% of the time FIM - Eating Eating Activity: 5: Set-up assist for cut food  Pain Pain Assessment Pain Assessment: No/denies pain Pain Score: 0-No pain  Therapy/Group: Individual Therapy  Charlane Ferretti., CCC-SLP [phone]  Kenn Rekowski 05/05/2013, 11:29 AM

## 2013-05-06 ENCOUNTER — Inpatient Hospital Stay (HOSPITAL_COMMUNITY): Payer: Medicare Other

## 2013-05-06 ENCOUNTER — Inpatient Hospital Stay (HOSPITAL_COMMUNITY): Payer: Medicare Other | Admitting: *Deleted

## 2013-05-06 ENCOUNTER — Inpatient Hospital Stay (HOSPITAL_COMMUNITY): Payer: Medicare Other | Admitting: Speech Pathology

## 2013-05-06 DIAGNOSIS — I69993 Ataxia following unspecified cerebrovascular disease: Secondary | ICD-10-CM

## 2013-05-06 DIAGNOSIS — I6330 Cerebral infarction due to thrombosis of unspecified cerebral artery: Secondary | ICD-10-CM

## 2013-05-06 NOTE — Progress Notes (Signed)
Speech Language Pathology Daily Session Note  Patient Details  Name: Ryan Brady MRN: [id number] Date of Birth: April 09, 1946  Today's Date: 05/06/2013 Time: 1130-1150 Time Calculation (min): 20 min  Short Term Goals: Week 2: SLP Short Term Goal 1 (Week 2): Pt will demonstrate selective attention to a functional task for 5 minutes with Min A verbal and question cues for redirection.  SLP Short Term Goal 2 (Week 2): Pt will demonstrate functional problem solving for basic and familiar tasks with Min A verbal cues.  SLP Short Term Goal 3 (Week 2): Pt will utilize call bell to express wants/needs with Mod A verbal and question cues.  SLP Short Term Goal 4 (Week 2): Pt will identify 2 physical and 2 cognitive deficits with Mod A question and contextual cues.   Skilled Therapeutic Interventions: Group, co-treatment session with OT addressed cognition and self-feeding goals. SLP facilitated session with diet upgrade, regular textures and thin liquids with increased wait time to problem solve tray set-up and initiate self-feeding.  Patient consumed meal with cough x1 during intake; however, patient demonstrated a strong cough and SLP suspects that he removes suspected penetrates.  Recommend to continue diet upgrade with intermittent staff supervision.        FIM:  Comprehension Comprehension Mode: Auditory Comprehension: 5-Understands basic 90% of the time/requires cueing < 10% of the time Expression Expression Mode: Verbal Expression: 3-Expresses basic 50 - 74% of the time/requires cueing 25 - 50% of the time. Needs to repeat parts of sentences. Social Interaction Social Interaction: 4-Interacts appropriately 75 - 89% of the time - Needs redirection for appropriate language or to initiate interaction. Problem Solving Problem Solving: 3-Solves basic 50 - 74% of the time/requires cueing 25 - 49% of the time Memory Memory: 3-Recognizes or recalls 50 - 74% of the time/requires cueing 25 - 49% of the  time FIM - Eating Eating Activity: 6: More than reasonable amount of time  Pain Pain Assessment Pain Assessment: No/denies pain  Therapy/Group: Group Therapy  Charlane Ferretti., CCC-SLP [phone]  [name] 05/06/2013, 12:56 PM

## 2013-05-06 NOTE — Progress Notes (Signed)
Patient ID: Ryan Brady, male   DOB: 11-May-1946, 67 y.o.   MRN: [id number] Subjective/Complaints:   Review of Systems  No new problems. Denies pain. Slept well.   A 12 point review of systems has been performed and if not noted above is otherwise negative.  Objective: Vital Signs: Blood pressure 129/81, pulse 94, temperature 98.1 F (36.7 C), temperature source Oral, resp. rate 18, height 5\' 9"  (1.753 m), weight 79.47 kg (175 lb 3.2 oz), SpO2 100.00%. No results found. Results for orders placed during the hospital encounter of 04/28/13 (from the past 72 hour(s))  CREATININE, SERUM     Status: Abnormal   Collection Time    05/05/13  6:10 AM      Result Value Range   Creatinine, Ser 1.20  0.50 - 1.35 mg/dL   GFR calc non Af Amer 60 (*) >90 mL/min   GFR calc Af Amer 69 (*) >90 mL/min   Comment:            The eGFR has been calculated     using the CKD EPI equation.     This calculation has not been     validated in all clinical     situations.     eGFR's persistently     <90 mL/min signify     possible Chronic Kidney Disease.     Eyes: EOM are normal.  Neck: Normal range of motion. Neck supple. No thyromegaly present.  Cardiovascular: Normal rate and regular rhythm.  Pulmonary/Chest: Effort normal and breath sounds normal. No respiratory distress.  Abdominal: Soft. Bowel sounds are normal. He exhibits no distension.  Neurological: He is alert.  Patient is dysarthric decreased awareness and attention Skin: Skin is warm and dry.  visual fields are intact the confrontational testing, left neglect noted with double simultaneous stimulation  Neuro:  Eyes without evidence of nystagmus  Tone is normal without evidence of spasticity  Cerebellar exam shows no evidence of ataxia on finger nose finger or heel to shin testing  No evidence of trunkal ataxia  Motor strength is 5/5 in bilateral deltoid, biceps, triceps, finger flexors and extensors, wrist flexors and extensors, hip  flexors, knee flexors and extensors, ankle dorsiflexors, plantar flexors, invertors and evertors, toe flexors and extensors  Difficulty following two-step commands.  Delayed more with use of the left side.   Assessment/Plan: 1. Functional deficits secondary to acute right thrombotic MCA infarct superimposed on bilateral basal ganglia chronic infarcts which require 3+ hours per day of interdisciplinary therapy in a comprehensive inpatient rehab setting. Physiatrist is providing close team supervision and 24 hour management of active medical problems listed below. Physiatrist and rehab team continue to assess barriers to discharge/monitor patient progress toward functional and medical goals. FIM: FIM - Bathing Bathing Steps Patient Completed: Chest;Right Arm;Left Arm;Abdomen;Front perineal area;Buttocks;Right upper leg;Left lower leg (including foot);Right lower leg (including foot);Left upper leg Bathing: 5: Supervision: Safety issues/verbal cues  FIM - Upper Body Dressing/Undressing Upper body dressing/undressing steps patient completed: Thread/unthread right sleeve of pullover shirt/dresss;Thread/unthread left sleeve of pullover shirt/dress;Put head through opening of pull over shirt/dress;Pull shirt over trunk Upper body dressing/undressing: 5: Set-up assist to: Obtain clothing/put away FIM - Lower Body Dressing/Undressing Lower body dressing/undressing steps patient completed: Thread/unthread left pants leg;Thread/unthread right pants leg;Pull pants up/down;Don/Doff right shoe;Don/Doff left shoe;Fasten/unfasten right shoe;Fasten/unfasten left shoe;Thread/unthread right underwear leg;Thread/unthread left underwear leg;Pull underwear up/down;Fasten/unfasten pants Lower body dressing/undressing: 5: Set-up assist to: Don/Doff TED stocking  FIM - Toileting Toileting steps completed by patient: Performs  perineal hygiene;Adjust clothing after toileting;Adjust clothing prior to toileting Toileting  Assistive Devices: Grab bar or rail for support Toileting: 5: Supervision: Safety issues/verbal cues  FIM - Diplomatic Services operational officer Devices: Grab bars Toilet Transfers: 5-To toilet/BSC: Supervision (verbal cues/safety issues);5-From toilet/BSC: Supervision (verbal cues/safety issues)  FIM - Press photographer Assistive Devices: Arm rests Bed/Chair Transfer: 5: Supine > Sit: Supervision (verbal cues/safety issues)  FIM - Locomotion: Wheelchair Locomotion: Wheelchair: 0: Activity did not occur FIM - Locomotion: Ambulation Locomotion: Ambulation Assistive Devices: Other (comment) (no assistive device) Ambulation/Gait Assistance: 5: Supervision Locomotion: Ambulation: 5: Travels 150 ft or more with supervision/safety issues  Comprehension Comprehension Mode: Auditory Comprehension: 4-Understands basic 75 - 89% of the time/requires cueing 10 - 24% of the time  Expression Expression Mode: Verbal Expression: 3-Expresses basic 50 - 74% of the time/requires cueing 25 - 50% of the time. Needs to repeat parts of sentences.  Social Interaction Social Interaction: 4-Interacts appropriately 75 - 89% of the time - Needs redirection for appropriate language or to initiate interaction.  Problem Solving Problem Solving: 3-Solves basic 50 - 74% of the time/requires cueing 25 - 49% of the time  Memory Memory: 2-Recognizes or recalls 25 - 49% of the time/requires cueing 51 - 75% of the time  Medical Problem List and Plan:  1. Thrombotic right MCA infarct , with history of bilateral subcortical infarcts 2. DVT Prophylaxis/Anticoagulation: Subcutaneous Lovenox. Monitor platelet counts any signs of bleeding  3. Pain Management: Tylenol as needed  4. Dysphagia. Dysphagia regular, thin liquid diet. no signs of aspiration and followup speech therapy  5. Mood/depression. Paxil 20 mg daily. Provide emotional support  6. Neuropsych: This patient is not capable of  making decisions on his own behalf.  7. Hypertension. Lisinopril 40 mg daily. Monitor with increased activity  8. ?Diabetes mellitus. Question diagnosis. cbg checks have been stopped 9.  Tobacco abuse: nicoderm, counseling   LOS (Days) 8 A FACE TO FACE EVALUATION WAS PERFORMED  Venice Marcucci T 05/06/2013, 9:16 AM

## 2013-05-06 NOTE — Patient Care Conference (Signed)
Inpatient RehabilitationTeam Conference and Plan of Care Update Date: 05/06/2013   Time: 10;25 Am    Patient Name: Ryan Brady      Medical Record Number: [id number]  Date of Birth: 08-09-1946 Sex: Male         Room/Bed: 4W07C/4W07C-01 Payor Info: Payor: MEDICARE / Plan: MEDICARE PART A AND B / Product Type: *No Product type* /    Admitting Diagnosis: RT CVA  Admit Date/Time:  04/28/2013  4:18 PM Admission Comments: No comment available   Primary Diagnosis:  Thrombotic cerebral infarction Principal Problem: Thrombotic cerebral infarction  Patient Active Problem List   Diagnosis Date Noted  . 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 Hypokalemia 04/26/2013  . CVA (cerebral infarction) 04/24/2013  . Accelerated hypertension 04/24/2013  . Leukocytosis 04/24/2013  . Depression 04/24/2013    Expected Discharge Date: Expected Discharge Date: 05/08/13  Team Members Present: Physician leading conference: Dr. Faith Rogue Social Worker Present: Dossie Der, LCSW Nurse Present: Other (comment) Milly Jakob Rcom-RN) PT Present: Edson Snowball, PT;Caroline Adriana Simas, PT;Other (comment) Clarisse Gouge Ripa-PT) OT Present: Other (comment);Leonette Monarch, Felipa Eth, OT (Kayla Perkinson-OT) SLP Present: Fae Pippin, SLP     Current Status/Progress Goal Weekly Team Focus  Medical   thrombotic right cva, baseline deficits, substantial cogntive deficits.   stabilize medically for dc  control bp, improve nutritional status   Bowel/Bladder   Incontinent at times wears a brief condom catheter at hs  continent of bowel and bladder  montior   Swallow/Nutrition/ Hydration   Dys.3 textures and thin   least restrictive p.o. intake  trials of regular   ADL's   supervision with self-care tasks, decreased attention to L and required cues to turn head to locate items, decreased problem solving, poor safety awareness  supervision   safety awareness, L attention, cognition, standing balance, family  education   Mobility   S overall; intermittent min guard for LOB  S overall  safety, activity tolerance, balance, gait, transfers, bed mobility, floor transfers, stairs   Communication   Min assist  Min assist  complete family education    Safety/Cognition/ Behavioral Observations  Mod-Max assist  Mod assist  increase safety awareness and requests for help   Pain   no complaints of pain         Skin   skin ok            *See Care Plan and progress notes for long and short-term goals.  Barriers to Discharge: cognitive deficits    Possible Resolutions to Barriers:  family ed and supervision for safety    Discharge Planning/Teaching Needs:  Home with son and daughter in-law-he has been here to observe in therpaies.        Team Discussion:  Reaching goals of supervision level-needs for safety and impulsivity, along with poor memory and carry over. Son needs to come in for more therapy prior to d/c.  Revisions to Treatment Plan:  None   Continued Need for Acute Rehabilitation Level of Care: The patient requires daily medical management by a physician with specialized training in physical medicine and rehabilitation for the following conditions: Daily direction of a multidisciplinary physical rehabilitation program to ensure safe treatment while eliciting the highest outcome that is of practical value to the patient.: Yes Daily medical management of patient stability for increased activity during participation in an intensive rehabilitation regime.: Yes Daily analysis of laboratory values and/or radiology reports with any subsequent need for medication adjustment of medical intervention for : Neurological problems  Lucy Chris 05/06/2013, 3:31 PM

## 2013-05-06 NOTE — Progress Notes (Signed)
Speech Language Pathology Daily Session Note  Patient Details  Name: Ryan Brady MRN: [id number] Date of Birth: 29-Jul-1946  Today's Date: 05/06/2013 Time: 0835-0900 Time Calculation (min): 25 min  Short Term Goals: Week 2: SLP Short Term Goal 1 (Week 2): Pt will demonstrate selective attention to a functional task for 5 minutes with Min A verbal and question cues for redirection.  SLP Short Term Goal 2 (Week 2): Pt will demonstrate functional problem solving for basic and familiar tasks with Min A verbal cues.  SLP Short Term Goal 3 (Week 2): Pt will utilize call bell to express wants/needs with Mod A verbal and question cues.  SLP Short Term Goal 4 (Week 2): Pt will identify 2 physical and 2 cognitive deficits with Mod A question and contextual cues.   Skilled Therapeutic Interventions: Skilled treatment session focused on addressing dysphagia goals.  SLP facilitated session with trials of regular textures during breakfast (Dys.3 textures and thin liquids).  Patient exhibited mildly prolonged mastication.  Patient also demonstrated cough x1 during breakfast.  Recommend trials tray to ensure safety throughout meal.    FIM:  Comprehension Comprehension Mode: Auditory Comprehension: 5-Understands basic 90% of the time/requires cueing < 10% of the time Expression Expression Mode: Verbal Expression: 3-Expresses basic 50 - 74% of the time/requires cueing 25 - 50% of the time. Needs to repeat parts of sentences. Social Interaction Social Interaction: 4-Interacts appropriately 75 - 89% of the time - Needs redirection for appropriate language or to initiate interaction. Problem Solving Problem Solving: 3-Solves basic 50 - 74% of the time/requires cueing 25 - 49% of the time Memory Memory: 3-Recognizes or recalls 50 - 74% of the time/requires cueing 25 - 49% of the time FIM - Eating Eating Activity: 5: Set-up assist for cut food  Pain Pain Assessment Pain Assessment: No/denies  pain  Therapy/Group: Individual Therapy  Charlane Ferretti., CCC-SLP [phone]  Ryan Brady 05/06/2013, 12:53 PM

## 2013-05-06 NOTE — Progress Notes (Signed)
Occupational Therapy Note  Patient Details  Name: Ryan Brady MRN: [id number] Date of Birth: Oct 21, 1946 Today's Date: 05/06/2013  Time: 1150-1205 (cotx with Speech Therapy-total time 1130-1205) Pt denies pain Group Therapy   Pt participated in self feeding group with focus on task initiation, attention to task, and swallowing strategies.  Pt required min verbal cues for portion control and rate.  Pt was able to open all containers and did not require verbal cues of attention.  Pt requires extra time to complete all tasks but does not require any assistance.  Lavone Neri [facility] 05/06/2013, 3:22 PM

## 2013-05-06 NOTE — Progress Notes (Signed)
Social Work Patient ID: [name], male   DOB: 1946-07-12, 67 y.o.   MRN: [id number] Met with pt and spoke with son via telephone to inform of team conference progression toward goals of supervision level. He is aware of pt's safety and impulsivity issues.  They will be moving next week into a three bedroom, until then pt will live with he and daughter in-law Where they currently live.  He will be here at 11:00 on Friday to speak with the team and prepare for discharge.  Will do home health due to transportation Issues.

## 2013-05-06 NOTE — Progress Notes (Signed)
Social Work Patient ID: Starr Engel, male   DOB: 1946-10-06, 67 y.o.   MRN: [id number] Lucy Chris, LCSW Social Worker Signed  Patient Care Conference Service date: 05/06/2013 3:31 PM  Inpatient RehabilitationTeam Conference and Plan of Care Update Date: 05/06/2013   Time: 10;25 Am     Patient Name: Caswell Alvillar       Medical Record Number: [id number]   Date of Birth: 08-Jun-1946 Sex: Male         Room/Bed: 4W07C/4W07C-01 Payor Info: Payor: MEDICARE / Plan: MEDICARE PART A AND B / Product Type: *No Product type* /   Admitting Diagnosis: RT CVA   Admit Date/Time:  04/28/2013  4:18 PM Admission Comments: No comment available   Primary Diagnosis:  Thrombotic cerebral infarction Principal Problem: Thrombotic cerebral infarction    Patient Active Problem List     Diagnosis  Date Noted   .  Thrombotic cerebral infarction  04/29/2013   .  Hypokalemia  04/26/2013   .  CVA (cerebral infarction)  04/24/2013   .  Accelerated hypertension  04/24/2013   .  Leukocytosis  04/24/2013   .  Depression  04/24/2013     Expected Discharge Date: Expected Discharge Date: 05/08/13  Team Members Present: Physician leading conference: Dr. Faith Rogue Social Worker Present: Dossie Der, LCSW Nurse Present: Other (comment) Milly Jakob Rcom-RN) PT Present: Edson Snowball, PT;Caroline Adriana Simas, PT;Other (comment) Clarisse Gouge Ripa-PT) OT Present: Other (comment);Leonette Monarch, Felipa Eth, OT (Kayla Perkinson-OT) SLP Present: Fae Pippin, SLP        Current Status/Progress  Goal  Weekly Team Focus   Medical     thrombotic right cva, baseline deficits, substantial cogntive deficits.   stabilize medically for dc  control bp, improve nutritional status   Bowel/Bladder     Incontinent at times wears a brief condom catheter at hs  continent of bowel and bladder  montior   Swallow/Nutrition/ Hydration     Dys.3 textures and thin   least restrictive p.o. intake  trials of regular   ADL's     supervision  with self-care tasks, decreased attention to L and required cues to turn head to locate items, decreased problem solving, poor safety awareness  supervision   safety awareness, L attention, cognition, standing balance, family education   Mobility     S overall; intermittent min guard for LOB  S overall  safety, activity tolerance, balance, gait, transfers, bed mobility, floor transfers, stairs   Communication     Min assist  Min assist  complete family education    Safety/Cognition/ Behavioral Observations    Mod-Max assist  Mod assist  increase safety awareness and requests for help   Pain     no complaints of pain       Skin     skin ok         *See Care Plan and progress notes for long and short-term goals.    Barriers to Discharge:  cognitive deficits      Possible Resolutions to Barriers:    family ed and supervision for safety      Discharge Planning/Teaching Needs:    Home with son and daughter in-law-he has been here to observe in therpaies.       Team Discussion:    Reaching goals of supervision level-needs for safety and impulsivity, along with poor memory and carry over. Son needs to come in for more therapy prior to d/c.   Revisions to Treatment Plan:    None  Continued Need for Acute Rehabilitation Level of Care: The patient requires daily medical management by a physician with specialized training in physical medicine and rehabilitation for the following conditions: Daily direction of a multidisciplinary physical rehabilitation program to ensure safe treatment while eliciting the highest outcome that is of practical value to the patient.: Yes Daily medical management of patient stability for increased activity during participation in an intensive rehabilitation regime.: Yes Daily analysis of laboratory values and/or radiology reports with any subsequent need for medication adjustment of medical intervention for : Neurological problems  Tryce Surratt, Lemar Livings 05/06/2013, 3:31 PM

## 2013-05-06 NOTE — Progress Notes (Signed)
Physical Therapy Session Note  Patient Details  Name: Ryan Brady MRN: [id number] Date of Birth: 10-20-1946  Today's Date: 05/06/2013 Time: 4098-1191 Time Calculation (min): 42 min  Short Term Goals: Week 1:  PT Short Term Goal 1 (Week 1): STGs=LTGs  Skilled Therapeutic Interventions/Progress Updates:    Patient received sitting in recliner, son present. Session focused on high level balance, stair negotiation, and activity tolerance. See details below for gait training and stair negotiation.  Patient exercised on NuStep Level 4 x10' with B UE and B LE to facilitate increased activity tolerance, coordination, and strength through reciprocal movements. Upon returning to patient's room, discussion with patient's son about discharge recommendations of 24/7 supervision with all mobility secondary to patient's balance deficits and continued decreased safety awareness and decreased insight into deficits. Patient's son verbalized understanding and in agreement with recommendations. Patient left sitting in recliner with seatbelt donned and all needs within reach, son present.  Therapy Documentation Precautions:  Precautions Precautions: Fall Precaution Comments: L inattention Restrictions Weight Bearing Restrictions: No Pain: Pain Assessment Pain Assessment: No/denies pain Pain Score: 0-No pain Locomotion : Ambulation Ambulation: Yes Ambulation/Gait Assistance: 5: Supervision Ambulation Distance (Feet): 175 Feet Assistive device: None Ambulation/Gait Assistance Details: Visual cues/gestures for precautions/safety;Verbal cues for precautions/safety Ambulation/Gait Assistance Details: Patient instructed in gait training 175' x2 in busy hallways. Patient continues to require cues to attend to the L and leave enough room when negotiating obstacles. Gait Gait: Yes Gait Pattern: Wide base of support;Step-through pattern;Decreased hip/knee flexion - left;Decreased hip/knee flexion -  right;Decreased trunk rotation High Level Ambulation High Level Ambulation: Other high level ambulation High Level Ambulation - Other Comments: 100' x2 changes in gait speed with supervision, no overt LOB Stairs / Additional Locomotion Stairs: Yes Stairs Assistance: 5: Supervision Stairs Assistance Details: Verbal cues for precautions/safety Stair Management Technique: One rail Right;Step to pattern;Forwards Number of Stairs: 3 Height of Stairs: 6   See FIM for current functional status  Therapy/Group: Individual Therapy  Chipper Herb. Derrika Ruffalo, PT, DPT 05/06/2013, 3:35 PM

## 2013-05-06 NOTE — Progress Notes (Signed)
Occupational Therapy Session Note  Patient Details  Name: Ryan Brady MRN: [id number] Date of Birth: 02-18-1946  Today's Date: 05/06/2013 Time: 0730-0830 and 0930-1000 Time Calculation (min): 60 min and 30 min   Short Term Goals: Week 1:  OT Short Term Goal 1 (Week 1): Refer to LTGs secondary to expected short LOS  Skilled Therapeutic Interventions/Progress Updates:    Session 1: Therapy session focused on safety awareness, dynamic standing balance, functional transfers, L attention, and ADL retraining. Pt completed all self-care tasks with supervision and increased time. Completed shower and grooming tasks in standing with no lob observed. Pt required cue to retrieve shirt from drawer as he only retrieved pants originally. Pt more socially appropriate on this date, engaging in more conversation and asking OT how her night was. No agitation noted on this date. Discussed responsibilities at home and safety awareness. Asked pt what he would do if he fell at home and pt reported "get up." Asked if he would call anyone and pt replied with "my son." Provided cues to ask who he would call if unable to call son or was in pain and pt reported "my son" each time. Educated on importance of calling ambulance.  Session 2: Therapy session focused on cognitive skills, functional ambulation, safety awareness, and L attention. Pt completed toileting task with supervision and increased time for pant fasteners. Pt engaged in microwave meal prep activity with supervision as he prepared grits, stating "I like to eat grits." Pt able to verbalize directions and required assist for specific measurement of water as he was unable to read it without directions. This was a familiar task for the pt therefore completed safely and with supervision. Completed tub transfer with TTB with supervision. Ambulated back to room with cues to read signs on L side and pt demonstrated slightly more awareness of objects and people in hallway  when on his L side. Nursing provided new nicotine patch during therapy session and pt requested cigarette. Provided education on not smoking at hospital and health risks with smoking. Pt reported dad and brother had cancer and they didn't smoke. Re-oriented pt to why he was at hospital and wanted to know why he had stroke. Nurse informed pt she would provided more extensive education after team conference.   Therapy Documentation Precautions:  Precautions Precautions: Fall Precaution Comments: L inattention Restrictions Weight Bearing Restrictions: No General:   Vital Signs: Therapy Vitals Temp: 98.1 F (36.7 C) Temp src: Oral Pulse Rate: 94 Resp: 18 BP: 129/81 mmHg Patient Position, if appropriate: Lying Oxygen Therapy SpO2: 100 % O2 Device: None (Room air) Pain: No c/o pain during therapy sessions  See FIM for current functional status  Therapy/Group: Individual Therapy  Oden Lindaman, Vara Guardian 05/06/2013, 8:25 AM

## 2013-05-07 ENCOUNTER — Inpatient Hospital Stay (HOSPITAL_COMMUNITY): Payer: Medicare Other | Admitting: Occupational Therapy

## 2013-05-07 ENCOUNTER — Inpatient Hospital Stay (HOSPITAL_COMMUNITY): Payer: Medicare Other | Admitting: *Deleted

## 2013-05-07 ENCOUNTER — Inpatient Hospital Stay (HOSPITAL_COMMUNITY): Payer: Medicare Other | Admitting: Speech Pathology

## 2013-05-07 ENCOUNTER — Inpatient Hospital Stay (HOSPITAL_COMMUNITY): Payer: Medicare Other

## 2013-05-07 NOTE — Progress Notes (Signed)
Speech Language Pathology Daily Session Note  Patient Details  Name: Ryan Brady MRN: [id number] Date of Birth: Jul 22, 1946  Today's Date: 05/07/2013 Time: 1145-1200 Time Calculation (min): 15 min  Short Term Goals: Week 2: SLP Short Term Goal 1 (Week 2): Pt will demonstrate selective attention to a functional task for 5 minutes with Min A verbal and question cues for redirection.  SLP Short Term Goal 2 (Week 2): Pt will demonstrate functional problem solving for basic and familiar tasks with Min A verbal cues.  SLP Short Term Goal 3 (Week 2): Pt will utilize call bell to express wants/needs with Mod A verbal and question cues.  SLP Short Term Goal 4 (Week 2): Pt will identify 2 physical and 2 cognitive deficits with Mod A question and contextual cues.   Skilled Therapeutic Interventions: Group, co-treatment session with OT addressed cognition, dysphagia and self-feeding goals. SLP facilitated session with increased wait time to problem solve tray-set up and to demonstrate efficient mastication of regular textures.  Patient consumed regular textures and thin liquids with no overt s/s of aspiration.  Goals met and patient ready for discharge with intermittent supervision.        FIM:  Comprehension Comprehension Mode: Auditory Comprehension: 5-Understands basic 90% of the time/requires cueing < 10% of the time Expression Expression Mode: Verbal Expression: 4-Expresses basic 75 - 89% of the time/requires cueing 10 - 24% of the time. Needs helper to occlude trach/needs to repeat words. Social Interaction Social Interaction: 5-Interacts appropriately 90% of the time - Needs monitoring or encouragement for participation or interaction. Problem Solving Problem Solving: 4-Solves basic 75 - 89% of the time/requires cueing 10 - 24% of the time Memory Memory: 3-Recognizes or recalls 50 - 74% of the time/requires cueing 25 - 49% of the time FIM - Eating Eating Activity: 6: More than reasonable  amount of time  Pain Pain Assessment Pain Assessment: No/denies pain  Therapy/Group: Group Therapy  Charlane Ferretti., CCC-SLP [phone]  Antonella Upson 05/07/2013, 1:30 PM

## 2013-05-07 NOTE — Progress Notes (Signed)
Occupational Therapy Note  Patient Details  Name: Ryan Brady MRN: [id number] Date of Birth: 12/24/1945 Today's Date: 05/07/2013  [employer] with SLP [phone] No c/o pain  Self feeding group with focus on increasing his independence with self feeding including setup and managing his own safe swallowing techniques. Pt with good use of bilateral UEs.   [facility] 05/07/2013, 4:30 PM

## 2013-05-07 NOTE — Progress Notes (Signed)
Social Work Patient ID: Ryan Brady, male   DOB: 02-05-1946, 67 y.o.   MRN: [id number] Have found a PCP for pt he is agreeable to have Dr Concepcion Elk on [address] see him. Also completing [employer] application to assist with his transportation issue.

## 2013-05-07 NOTE — Progress Notes (Signed)
Speech Language Pathology Daily Session Note & Discharge Summary   Patient Details  Name: Ryan Brady MRN: [id number] Date of Birth: 05-14-1946  Today's Date: 05/07/2013 Time: 6578-4696 Time Calculation (min): 45 min  Short Term Goals: Week 2: SLP Short Term Goal 1 (Week 2): Pt will demonstrate selective attention to a functional task for 5 minutes with Min A verbal and question cues for redirection.  SLP Short Term Goal 2 (Week 2): Pt will demonstrate functional problem solving for basic and familiar tasks with Min A verbal cues.  SLP Short Term Goal 3 (Week 2): Pt will utilize call bell to express wants/needs with Mod A verbal and question cues.  SLP Short Term Goal 4 (Week 2): Pt will identify 2 physical and 2 cognitive deficits with Mod A question and contextual cues.   Skilled Therapeutic Interventions: Skilled treatment session focused on addressing dysphagia and cognition goals.  SLP facilitated session with breakfast tray of regular textures and thin liquids with increased wait time to sequence and problem solve set up as well as increased wait time to request help as needed intermittently.  Patient exhibited cough x1 during meal which was related to oral residue and the fact that patient had not been alternating solids and liquids.  Patient was able to problem solve and get drink ready with increased wait time.  SLP also facilitated session with conversation regarding tasks that patient will need assist with at home and he required Mod cues to identify verbally; however, in a functional tasks patient stated that he needed help with it to complete.     FIM:  Comprehension Comprehension Mode: Auditory Comprehension: 5-Understands basic 90% of the time/requires cueing < 10% of the time Expression Expression Mode: Verbal Expression: 4-Expresses basic 75 - 89% of the time/requires cueing 10 - 24% of the time. Needs helper to occlude trach/needs to repeat words. Social Interaction Social  Interaction: 5-Interacts appropriately 90% of the time - Needs monitoring or encouragement for participation or interaction. Problem Solving Problem Solving: 3-Solves basic 50 - 74% of the time/requires cueing 25 - 49% of the time Memory Memory: 3-Recognizes or recalls 50 - 74% of the time/requires cueing 25 - 49% of the time FIM - Eating Eating Activity: 6: More than reasonable amount of time  Pain Pain Assessment Pain Assessment: No/denies pain Pain Score: 0-No pain  Therapy/Group: Individual Therapy   Speech Language Pathology Discharge Summary  Patient Details  Name: Ryan Brady MRN: [id number] Date of Birth: [birth year]  Today's Date: 05/07/2013  Patient has met 6 of 6 long term goals.  Patient to discharge at overall Mod;Supervision;Min level.  Reasons goals not met: n/a   Clinical Impression/Discharge Summary: Patient made functional gains during CIR stay by progressing from Mod-Max assist to Min-Mod assist.  Currently patient requires intermittent supervision for safe p.o. intake, Min assist for communication and Mod assist for cognitive tasks.  Functional tasks increase awareness in the moment and patient is beginning to request help appropriately.  As a result, it is recommended that this patient continue to receive skilled SLP services in the next venue to reduce burden of care.    Care Partner:  Caregiver Able to Provide Assistance: Yes  Type of Caregiver Assistance: Cognitive  Recommendation:  Home Health SLP;24 hour supervision/assistance  Rationale for SLP Follow Up: Maximize functional communication;Maximize cognitive function and independence;Maximize swallowing safety;Reduce caregiver burden   Equipment: none   Reasons for discharge: Treatment goals met;Discharged from hospital   Patient/Family Agrees with Progress Made and  Goals Achieved: Yes   See FIM for current functional status  Charlane Ferretti., CCC-SLP [phone]  [name] 05/07/2013, 4:47  PM

## 2013-05-07 NOTE — Progress Notes (Signed)
Patient ID: Ryan Brady, male   DOB: 24-Mar-1946, 67 y.o.   MRN: [id number] Subjective/Complaints:   Review of Systems  Up with therapy to go to BR. Forgot that he needed to go to BR by the time he was standing next to toilet   A 12 point review of systems has been performed and if not noted above is otherwise negative.  Objective: Vital Signs: Blood pressure 115/72, pulse 79, temperature 98.5 F (36.9 C), temperature source Oral, resp. rate 20, height 5\' 9"  (1.753 m), weight 83.5 kg (184 lb 1.4 oz), SpO2 98.00%. No results found. Results for orders placed during the hospital encounter of 04/28/13 (from the past 72 hour(s))  CREATININE, SERUM     Status: Abnormal   Collection Time    05/05/13  6:10 AM      Result Value Range   Creatinine, Ser 1.20  0.50 - 1.35 mg/dL   GFR calc non Af Amer 60 (*) >90 mL/min   GFR calc Af Amer 69 (*) >90 mL/min   Comment:            The eGFR has been calculated     using the CKD EPI equation.     This calculation has not been     validated in all clinical     situations.     eGFR's persistently     <90 mL/min signify     possible Chronic Kidney Disease.     Eyes: EOM are normal.  Neck: Normal range of motion. Neck supple. No thyromegaly present.  Cardiovascular: Normal rate and regular rhythm.  Pulmonary/Chest: Effort normal and breath sounds normal. No respiratory distress.  Abdominal: Soft. Bowel sounds are normal. He exhibits no distension.  Neurological: He is alert.  Patient is dysarthric decreased awareness and attention Skin: Skin is warm and dry.  visual fields are intact the confrontational testing, left neglect noted with double simultaneous stimulation  Neuro:  Eyes without evidence of nystagmus  Tone is normal without evidence of spasticity  Cerebellar exam shows no evidence of ataxia on finger nose finger or heel to shin testing  No evidence of trunkal ataxia  Motor strength is 5/5 in bilateral deltoid, biceps, triceps, finger  flexors and extensors, wrist flexors and extensors, hip flexors, knee flexors and extensors, ankle dorsiflexors, plantar flexors, invertors and evertors, toe flexors and extensors  Difficulty following two-step commands.  Delayed more with use of the left side. Ambulates with wide based gait. Delayed processing, distracted   Assessment/Plan: 1. Functional deficits secondary to acute right thrombotic MCA infarct superimposed on bilateral basal ganglia chronic infarcts which require 3+ hours per day of interdisciplinary therapy in a comprehensive inpatient rehab setting. Physiatrist is providing close team supervision and 24 hour management of active medical problems listed below. Physiatrist and rehab team continue to assess barriers to discharge/monitor patient progress toward functional and medical goals. FIM: FIM - Bathing Bathing Steps Patient Completed: Chest;Right Arm;Left Arm;Abdomen;Front perineal area;Buttocks;Right upper leg;Left lower leg (including foot);Right lower leg (including foot);Left upper leg Bathing: 5: Supervision: Safety issues/verbal cues  FIM - Upper Body Dressing/Undressing Upper body dressing/undressing steps patient completed: Thread/unthread right sleeve of pullover shirt/dresss;Thread/unthread left sleeve of pullover shirt/dress;Put head through opening of pull over shirt/dress;Pull shirt over trunk Upper body dressing/undressing: 5: Supervision: Safety issues/verbal cues FIM - Lower Body Dressing/Undressing Lower body dressing/undressing steps patient completed: Thread/unthread left pants leg;Thread/unthread right pants leg;Pull pants up/down;Don/Doff right shoe;Don/Doff left shoe;Fasten/unfasten right shoe;Fasten/unfasten left shoe;Thread/unthread right underwear leg;Thread/unthread left underwear leg;Pull  underwear up/down;Fasten/unfasten pants Lower body dressing/undressing: 5: Set-up assist to: Don/Doff TED stocking  FIM - Toileting Toileting steps completed by  patient: Performs perineal hygiene;Adjust clothing after toileting;Adjust clothing prior to toileting Toileting Assistive Devices: Grab bar or rail for support Toileting: 5: Supervision: Safety issues/verbal cues  FIM - Diplomatic Services operational officer Devices: Grab bars Toilet Transfers: 5-To toilet/BSC: Supervision (verbal cues/safety issues);5-From toilet/BSC: Supervision (verbal cues/safety issues)  FIM - Press photographer Assistive Devices: Arm rests Bed/Chair Transfer: 5: Supine > Sit: Supervision (verbal cues/safety issues);5: Bed > Chair or W/C: Supervision (verbal cues/safety issues)  FIM - Locomotion: Wheelchair Locomotion: Wheelchair: 0: Activity did not occur FIM - Locomotion: Ambulation Locomotion: Ambulation Assistive Devices: Other (comment) (no assistive device) Ambulation/Gait Assistance: 5: Supervision Locomotion: Ambulation: 5: Travels 150 ft or more with supervision/safety issues  Comprehension Comprehension Mode: Auditory Comprehension: 4-Understands basic 75 - 89% of the time/requires cueing 10 - 24% of the time  Expression Expression Mode: Verbal Expression: 3-Expresses basic 50 - 74% of the time/requires cueing 25 - 50% of the time. Needs to repeat parts of sentences.  Social Interaction Social Interaction: 4-Interacts appropriately 75 - 89% of the time - Needs redirection for appropriate language or to initiate interaction.  Problem Solving Problem Solving: 3-Solves basic 50 - 74% of the time/requires cueing 25 - 49% of the time  Memory Memory: 3-Recognizes or recalls 50 - 74% of the time/requires cueing 25 - 49% of the time  Medical Problem List and Plan:  1. Thrombotic right MCA infarct , with history of bilateral subcortical infarcts 2. DVT Prophylaxis/Anticoagulation: Subcutaneous Lovenox. Monitor platelet counts any signs of bleeding  3. Pain Management: Tylenol as needed  4. Dysphagia. Dysphagia regular, thin liquid  diet. no signs of aspiration and followup speech therapy  5. Mood/depression. Paxil 20 mg daily. Provide emotional support  6. Neuropsych: This patient is not capable of making decisions on his own behalf.  7. Hypertension. Lisinopril 40 mg daily. Monitor with increased activity  8. ?Diabetes mellitus. cbg's have been dc'ed 9.  Tobacco abuse: nicoderm, counseling   LOS (Days) 9 A FACE TO FACE EVALUATION WAS PERFORMED  SWARTZ,ZACHARY T 05/07/2013, 9:22 AM

## 2013-05-07 NOTE — Discharge Summary (Signed)
  Discharge summary job [id number]

## 2013-05-07 NOTE — Discharge Summary (Signed)
NAMEGANON, DEMASI NO.:  [id number]  MEDICAL RECORD NO.:  [id number]  LOCATION:  4W07C                        FACILITY:  [facility]  PHYSICIAN:  Erick Colace, M.D.DATE OF BIRTH:  [birth year]  DATE OF ADMISSION:  04/28/2013 DATE OF DISCHARGE:                              DISCHARGE SUMMARY   DISCHARGE DIAGNOSES: 1. Thrombotic right MCA infarction. 2. Subcutaneous Lovenox for deep vein thrombosis prophylaxis, pain     management, dysphagia, depression, hypertension, and diabetes     mellitus with peripheral neuropathy.  HISTORY OF PRESENT ILLNESS:  This is a 67 year old right-handed male with history of CVA as well as diabetes mellitus, who was admitted on April 24, 2013, with difficulty speaking.  The patient was independent prior to admission.  MRI of the brain showed acute right MCA territory infarct as well as bilateral chronic lacunar infarcts affecting the right and left basal ganglia.  MRA of the head with subtotal occlusion left A1-ACA.  Echocardiogram with ejection fraction of 70% grade 1 diastolic dysfunction without emboli.  Carotid Dopplers with 0-39% right ICA stenosis.  Left ICA flow not visualized past the very proximal portion.  The patient did not receive tPA.  Placed on Plavix therapy for CVA prophylaxis as well as subcutaneous Lovenox for DVT prophylaxis. The patient had been on aspirin prior to admission.  Currently, maintained on a dysphagia to thin liquid diet.  Physical and occupational speech therapy ongoing.  The patient was admitted for comprehensive rehab program.  PAST MEDICAL HISTORY:  See discharge diagnoses.  SOCIAL HISTORY:  Lives with family.  FUNCTIONAL HISTORY:  Prior to admission independent.  FUNCTIONAL STATUS:  Upon admission to rehab services was moderate assist and ambulate 150 feet with a rolling walker.  PHYSICAL EXAMINATION:  VITAL SIGNS:  Blood pressure 141/82, pulse 84, temperature 99, respirations  18. GENERAL:  This was an alert male.  No acute distress.  He was a phasic, poor runs of deficits with decreased attention. HEENT:  Pupils round and reactive to light. LUNGS:  Clear to auscultation. CARDIAC:  Rate controlled. ABDOMEN:  Soft, nontender.  Good bowel sounds.  REHABILITATION HOSPITAL COURSE:  The patient was admitted to inpatient rehab services with therapies initiated on a 3-hour daily basis consisting of physical therapy, occupational therapy, speech therapy, and 24-hour rehabilitation nursing.  The following issues were addressed during the patient's rehabilitation stay.  Pertaining to Mr. Riojas thrombotic right MCA infarct remained on Plavix therapy.  Subcutaneous Lovenox for DVT prophylaxis.  His diet was steadily advanced to a regular consistency.  Noted mood with depression maintained on Paxil daily.  Ritalin had been established to help patient attained better abilities to focus and attend to tasks.  Blood pressures remained well controlled on lisinopril.  He did have a history of diabetes mellitus. Diet controlled with hemoglobin A1c of 5.6.  Established by family, the patient did have a history of tobacco abuse, maintained on a NicoDerm patch, and receive counseling in regards to cessation of nicotine products.  It was questionable.  The patient would be compliant with these request.  The patient received weekly collaborative interdisciplinary team conferences to discuss estimated length of stay, family teaching, and  any barriers to discharge.  He was incontinent at times with routine toileting using a condom catheter at bedtime.  The patient was supervision with self-care tasks, decrease in attention to the left and required cues to turn his head to locate items. Supervision overall mobility intermittent minimal guard with loss of balance.  Full family teaching was completed with family.  It was discussed the need for 24-hour care for patient's safety  and impulsively.  DISCHARGE MEDICATIONS:  Plavix 75 mg p.o. daily, lisinopril 40 mg p.o. daily, Ritalin 5 mg p.o. b.i.d. at 7 a.m. and 12 noon, NicoDerm patch 7 mg daily x3 weeks and stop Paxil 20 mg p.o. daily.  DIET:  Regular.  DISCHARGE FOLLOWUP:  The patient would follow up Dr. Claudette Laws, the outpatient rehab center June 12, 2013.  Social worker continued to establish need for primary care provider.  Ongoing therapies had been arranged as per rehab services.     Mariam Dollar, P.A.   ______________________________ Erick Colace, M.D.    DA/MEDQ  D:  05/07/2013  T:  05/07/2013  Job:  [id number]

## 2013-05-07 NOTE — Progress Notes (Signed)
Occupational Therapy Discharge Summary  Patient Details  Name: Ryan Brady MRN: [id number] Date of Birth: 10-Sep-1946  Today's Date: 05/07/2013 Time: 0730-0824 Time Calculation (min): 54 min  Patient has met 11 of 11 long term goals due to improved activity tolerance, improved balance, postural control and improved attention.  Patient to discharge at overall Supervision level.  Patient's care partner is independent to provide the necessary physical and cognitive assistance at discharge.  Patient is discharging home with son and daughter-in-law. Verbal education completed with son and he feels confident at providing supervision level care. Son is aware safety concerns with patient and is able to provide assist.   Reasons goals not met: N/A  Recommendation:  Patient will benefit from ongoing skilled OT services in home health setting to continue to advance functional skills in the area of BADL.  Equipment: TTB  Reasons for discharge: treatment goals met  Patient/family agrees with progress made and goals achieved: Yes  Skilled Therapeutic Intervention Therapy session focused on ADL retraining, dynamic standing balance, functional ambulation, and safety awareness. Pt received supine in bed and requested to complete toilet task. Pt standing with supervision and doctor entered room. Doctor instructed pt to continue with what he was doing and pt ambulated into bathroom with supervision then forgot what he planned on doing upon entering. Cued by asking pt and he reported therapists instructed him to come into bathroom. Cued by asking if needed to complete toileting task and pt reported yes then completed with supervision. Completed dressing and bathing at supervision level with increased time. Pt with no lob when standing to complete grooming tasks and retrieving clothing. Discussed safety at home during emergency situation and pt with decreased carryover to call 911 stating "I would still call my  son." Pt left sitting in recliner chair with all needs in reach and reported no questions or concerns about discharge at this time.   OT Discharge Precautions/Restrictions    General   Vital Signs Therapy Vitals Temp: 98.5 F (36.9 C) Temp src: Oral Pulse Rate: 79 Resp: 20 BP: 115/72 mmHg Oxygen Therapy SpO2: 98 % O2 Device: None (Room air) Pain   ADL   Vision/Perception  Vision - History Baseline Vision: Bifocals Visual History: Glaucoma Patient Visual Report: Unable to keep objects in focus Vision - Assessment Eye Alignment: Within Functional Limits Vision Assessment: Vision tested Tracking/Visual Pursuits: Requires cues, head turns, or add eye shifts to track;Other (comment) (decreased tracking in left visual field; requires head turn) Visual Fields: Left homonymous hemianopsia  Cognition Attention: Sustained Sustained Attention: Appears intact Memory: Impaired Memory Impairment: Decreased short term memory;Decreased recall of new information;Retrieval deficit Awareness: Impaired Awareness Impairment: Intellectual impairment Problem Solving: Impaired Behaviors: Restless;Impulsive;Lability Safety/Judgment: Impaired Sensation Coordination Fine Motor Movements are Fluid and Coordinated: Yes Motor    Mobility     Trunk/Postural Assessment     Balance   Extremity/Trunk Assessment RUE Assessment RUE Assessment: Within Functional Limits LUE Assessment LUE Assessment: Within Functional Limits  See FIM for current functional status  Fritz Cauthon, Vara Guardian 05/07/2013, 8:26 AM

## 2013-05-07 NOTE — Progress Notes (Signed)
Physical Therapy Discharge Summary  Patient Details  Name: Ryan Brady MRN: [id number] Date of Birth: 06/20/1946  Today's Date: 05/07/2013 Time: 7829-5621 Time Calculation (min): 60 min  Patient has met 11 of 11 long term goals due to improved activity tolerance, improved balance, improved postural control, increased strength, improved attention, improved awareness and improved coordination.  Patient to discharge at an ambulatory level Supervision.   Patient's care partner is independent to provide the necessary cognitive assistance at discharge. Discussion with patient's son, who he will be living with, about discharge recommendations of 24/7 supervision with all mobility secondary to patient's balance deficits and continued decreased safety awareness and decreased insight into deficits. Patient's son verbalized understanding and in agreement with recommendations.   Reasons goals not met: N/A, all LTGs met  Recommendation:  Patient will benefit from ongoing skilled PT services in home health setting to continue to advance safe functional mobility, address ongoing impairments in activity tolerance, balance, postural control, strength, attention, awareness/insight into deficits, coordination, gait impairments, and minimize fall risk.  Equipment: No equipment provided  Reasons for discharge: treatment goals met and discharge from hospital  Patient/family agrees with progress made and goals achieved: Yes  PT Discharge Precautions/Restrictions Precautions Precautions: Fall Precaution Comments: L inattention Restrictions Weight Bearing Restrictions: No Pain Pain Assessment Pain Assessment: No/denies pain Pain Score: 0-No pain Vision/Perception  Vision - History Baseline Vision: Bifocals Visual History: Glaucoma Patient Visual Report: Unable to keep objects in focus  Cognition Overall Cognitive Status: Impaired/Different from baseline Arousal/Alertness: Awake/alert Orientation  Level: Oriented X4 Problem Solving: Impaired Safety/Judgment: Impaired Sensation Sensation Light Touch: Appears Intact Proprioception: Appears Intact Additional Comments: in bil legs, despite being diabetic no reports of peripherial neuropathy Coordination Gross Motor Movements are Fluid and Coordinated: No Fine Motor Movements are Fluid and Coordinated: No Coordination and Movement Description: decreased speed and accuracy with rapid, alternating movements Heel Shin Test: pt with decreased coordination of this bil vs decreased ability to process commands of task Motor  Motor Motor - Discharge Observations: No clonus noted Bilaterally  Mobility Bed Mobility Bed Mobility: Supine to Sit;Sit to Supine Supine to Sit: 5: Supervision;HOB flat Supine to Sit Details: Verbal cues for precautions/safety Sit to Supine: 5: Supervision;HOB flat Sit to Supine - Details: Verbal cues for precautions/safety Transfers Sit to Stand: 5: Supervision;With upper extremity assist;From chair/3-in-1;From bed Sit to Stand Details: Verbal cues for precautions/safety Stand to Sit: 5: Supervision;With upper extremity assist;With armrests;To chair/3-in-1;To bed Stand to Sit Details (indicate cue type and reason): Verbal cues for precautions/safety Stand Pivot Transfers: 5: Supervision;With armrests Stand Pivot Transfer Details: Verbal cues for precautions/safety Locomotion  Ambulation Ambulation: Yes Ambulation/Gait Assistance: 5: Supervision Ambulation Distance (Feet): 200 Feet Assistive device: None Ambulation/Gait Assistance Details: Visual cues/gestures for precautions/safety;Verbal cues for precautions/safety Ambulation/Gait Assistance Details: Patient instructed in gait training 200' x2 in controlled environment and busy community environment in hallways. Patient also performed gait training x60' on carpet in home environment of [place] apartment (confined spaces, obstacle negotiation). Patient performed  gait training >500' off unit, on/off elevator, in lobby (negotiating people and obstacles), outside on uneven surfaces (inclines/declines/grass) with supervision. Gait Gait: Yes Gait Pattern: Wide base of support;Step-through pattern;Decreased hip/knee flexion - left;Decreased hip/knee flexion - right;Decreased trunk rotation Stairs / Additional Locomotion Stairs: Yes Stairs Assistance: 5: Supervision Stairs Assistance Details: Verbal cues for precautions/safety Stair Management Technique: One rail Right;Step to pattern;Forwards Number of Stairs: 12 Height of Stairs: 6 Wheelchair Mobility Wheelchair Mobility: No  Trunk/Postural Assessment  Cervical Assessment Cervical Assessment:  Within Functional Limits Thoracic Assessment Thoracic Assessment: Within Functional Limits Lumbar Assessment Lumbar Assessment: Within Functional Limits Postural Control Postural Control: Within Functional Limits  Balance Balance Balance Assessed: Yes Standardized Balance Assessment Standardized Balance Assessment: Berg Balance Test Berg Balance Test Sit to Stand: Able to stand without using hands and stabilize independently Standing Unsupported: Able to stand 2 minutes with supervision Sitting with Back Unsupported but Feet Supported on Floor or Stool: Able to sit safely and securely 2 minutes Stand to Sit: Sits safely with minimal use of hands Transfers: Able to transfer safely, definite need of hands Standing Unsupported with Eyes Closed: Able to stand 10 seconds safely Standing Ubsupported with Feet Together: Able to place feet together independently and stand 1 minute safely From Standing, Reach Forward with Outstretched Arm: Can reach forward >5 cm safely (2") From Standing Position, Pick up Object from Floor: Able to pick up shoe, needs supervision From Standing Position, Turn to Look Behind Over each Shoulder: Looks behind one side only/other side shows less weight shift Turn 360 Degrees: Able  to turn 360 degrees safely in 4 seconds or less Standing Unsupported, Alternately Place Feet on Step/Stool: Able to stand independently and safely and complete 8 steps in 20 seconds Standing Unsupported, One Foot in Front: Able to plae foot ahead of the other independently and hold 30 seconds Standing on One Leg: Able to lift leg independently and hold equal to or more than 3 seconds Total Score: 47 Static Sitting Balance Static Sitting - Balance Support: No upper extremity supported;Feet supported Static Sitting - Level of Assistance: 5: Stand by assistance Static Standing Balance Static Standing - Balance Support: No upper extremity supported Static Standing - Level of Assistance: 5: Stand by assistance Extremity Assessment  RLE Assessment RLE Assessment: Within Functional Limits (4/5 grossly) LLE Assessment LLE Assessment: Within Functional Limits (4/5 grossly)  See FIM for current functional status  Anwar Crill S Chinmayi Rumer S. Donnis Phaneuf, PT, DPT 05/07/2013, 4:29 PM

## 2013-05-08 DIAGNOSIS — I69993 Ataxia following unspecified cerebrovascular disease: Secondary | ICD-10-CM

## 2013-05-08 DIAGNOSIS — I6330 Cerebral infarction due to thrombosis of unspecified cerebral artery: Secondary | ICD-10-CM

## 2013-05-08 MED ORDER — BACLOFEN 10 MG PO TABS: 10.0000 mg | ORAL_TABLET | Freq: Four times a day (QID) | ORAL | Status: DC | PRN

## 2013-05-08 MED ORDER — LISINOPRIL 40 MG PO TABS: 40.0000 mg | ORAL_TABLET | Freq: Every day | ORAL | Status: DC

## 2013-05-08 MED ORDER — PAROXETINE HCL 20 MG PO TABS: 20.0000 mg | ORAL_TABLET | ORAL | Status: DC

## 2013-05-08 MED ORDER — METHYLPHENIDATE HCL 5 MG PO TABS: 5.0000 mg | ORAL_TABLET | Freq: Two times a day (BID) | ORAL | Status: DC

## 2013-05-08 MED ORDER — CLOPIDOGREL BISULFATE 75 MG PO TABS: 75.0000 mg | ORAL_TABLET | Freq: Every day | ORAL | Status: DC

## 2013-05-08 MED ORDER — NICOTINE 7 MG/24HR TD PT24: 1.0000 | MEDICATED_PATCH | Freq: Every day | TRANSDERMAL | Status: DC

## 2013-05-08 NOTE — Plan of Care (Signed)
Problem: RH SAFETY Goal: RH STG ADHERE TO SAFETY PRECAUTIONS W/ASSISTANCE/DEVICE STG Adhere to Safety Precautions With mod Assistance/Device.  Outcome: Not Met (add Reason) Patient out of bed without assistance from staff due to urinary urgency.

## 2013-05-08 NOTE — Progress Notes (Signed)
Patient ID: Ryan Brady, male   DOB: [birth year], 67 y.o.   MRN: [id number] Subjective/Complaints:   Review of Systems  No new issues. Rested well.   A 12 point review of systems has been performed and if not noted above is otherwise negative.  Objective: Vital Signs: Blood pressure 119/99, pulse 75, temperature 98.3 F (36.8 C), temperature source Oral, resp. rate 19, height 5\' 9"  (1.753 m), weight 76.7 kg (169 lb 1.5 oz), SpO2 93.00%. No results found. No results found for this or any previous visit (from the past 72 hour(s)).   Eyes: EOM are normal.  Neck: Normal range of motion. Neck supple. No thyromegaly present.  Cardiovascular: Normal rate and regular rhythm.  Pulmonary/Chest: Effort normal and breath sounds normal. No respiratory distress.  Abdominal: Soft. Bowel sounds are normal. He exhibits no distension.  Neurological: He is alert.  Patient is dysarthric decreased awareness and attention Skin: Skin is warm and dry.  visual fields are intact the confrontational testing, left neglect noted with double simultaneous stimulation  Neuro:  Eyes without evidence of nystagmus  Tone is normal without evidence of spasticity  Cerebellar exam shows no evidence of ataxia on finger nose finger or heel to shin testing  No evidence of trunkal ataxia  Motor strength is 5/5 in bilateral deltoid, biceps, triceps, finger flexors and extensors, wrist flexors and extensors, hip flexors, knee flexors and extensors, ankle dorsiflexors, plantar flexors, invertors and evertors, toe flexors and extensors  Difficulty following two-step commands.  Delayed more with use of the left side. Ambulates with wide based gait. Delayed processing, distracted   Assessment/Plan: 1. Functional deficits secondary to acute right thrombotic MCA infarct superimposed on bilateral basal ganglia chronic infarcts which require 3+ hours per day of interdisciplinary therapy in a comprehensive inpatient rehab  setting. Physiatrist is providing close team supervision and 24 hour management of active medical problems listed below. Physiatrist and rehab team continue to assess barriers to discharge/monitor patient progress toward functional and medical goals. FIM: FIM - Bathing Bathing Steps Patient Completed: Chest;Right Arm;Left Arm;Abdomen;Front perineal area;Buttocks;Right upper leg;Left lower leg (including foot);Right lower leg (including foot);Left upper leg Bathing: 5: Supervision: Safety issues/verbal cues  FIM - Upper Body Dressing/Undressing Upper body dressing/undressing steps patient completed: Thread/unthread right sleeve of pullover shirt/dresss;Thread/unthread left sleeve of pullover shirt/dress;Put head through opening of pull over shirt/dress;Pull shirt over trunk Upper body dressing/undressing: 5: Supervision: Safety issues/verbal cues FIM - Lower Body Dressing/Undressing Lower body dressing/undressing steps patient completed: Thread/unthread left pants leg;Thread/unthread right pants leg;Pull pants up/down;Don/Doff right shoe;Don/Doff left shoe;Fasten/unfasten right shoe;Fasten/unfasten left shoe;Thread/unthread right underwear leg;Thread/unthread left underwear leg;Pull underwear up/down;Fasten/unfasten pants Lower body dressing/undressing: 5: Set-up assist to: Don/Doff TED stocking  FIM - Toileting Toileting steps completed by patient: Performs perineal hygiene;Adjust clothing after toileting;Adjust clothing prior to toileting Toileting Assistive Devices: Grab bar or rail for support Toileting: 5: Supervision: Safety issues/verbal cues  FIM - Diplomatic Services operational officer Devices: Grab bars Toilet Transfers: 5-To toilet/BSC: Supervision (verbal cues/safety issues);5-From toilet/BSC: Supervision (verbal cues/safety issues)  FIM - Press photographer Assistive Devices: Arm rests Bed/Chair Transfer: 5: Supine > Sit: Supervision (verbal cues/safety  issues);5: Bed > Chair or W/C: Supervision (verbal cues/safety issues);5: Chair or W/C > Bed: Supervision (verbal cues/safety issues);5: Sit > Supine: Supervision (verbal cues/safety issues)  FIM - Locomotion: Wheelchair Locomotion: Wheelchair: 0: Activity did not occur FIM - Locomotion: Ambulation Locomotion: Ambulation Assistive Devices: Other (comment) (no assistive device) Ambulation/Gait Assistance: 5: Supervision Locomotion: Ambulation: 5: Travels 150  ft or more with supervision/safety issues  Comprehension Comprehension Mode: Auditory Comprehension: 5-Understands basic 90% of the time/requires cueing < 10% of the time  Expression Expression Mode: Verbal Expression: 4-Expresses basic 75 - 89% of the time/requires cueing 10 - 24% of the time. Needs helper to occlude trach/needs to repeat words.  Social Interaction Social Interaction: 5-Interacts appropriately 90% of the time - Needs monitoring or encouragement for participation or interaction.  Problem Solving Problem Solving: 3-Solves basic 50 - 74% of the time/requires cueing 25 - 49% of the time  Memory Memory: 3-Recognizes or recalls 50 - 74% of the time/requires cueing 25 - 49% of the time  Medical Problem List and Plan:  1. Thrombotic right MCA infarct , with history of bilateral subcortical infarcts 2. DVT Prophylaxis/Anticoagulation: Subcutaneous Lovenox.   3. Pain Management: Tylenol as needed  4. Dysphagia. Dysphagia regular, thin liquid diet. no signs of aspiration and followup speech therapy  5. Mood/depression. Paxil 20 mg daily. Provide emotional support  6. Neuropsych: This patient is not capable of making decisions on his own behalf.  7. Hypertension. Lisinopril 40 mg daily. Has had spikes in bp but generally with improved control 8. ?Diabetes mellitus. cbg's have been dc'ed 9.  Tobacco abuse: nicoderm, counseling   LOS (Days) 10 A FACE TO FACE EVALUATION WAS PERFORMED  Briyah Wheelwright T 05/08/2013, 9:45  AM

## 2013-05-08 NOTE — Progress Notes (Signed)
Pt discharged home with son, Dan Finland PA provided discharge instructions, pt and son verbalized an understanding and denied any questions or concerns

## 2013-05-08 NOTE — Progress Notes (Signed)
Social Work Discharge Note Discharge Note  The overall goal for the admission was met for:   Discharge location: Yes-HOME WITH SON AND DAUGHTER IN-LAW, SON TO PROVIDE SUPERVISION LEVEL  Length of Stay: Yes-10 DAYS  Discharge activity level: Yes-SUPERVISION LEVEL  Home/community participation: Yes  Services provided included: MD, RD, PT, OT, SLP, RN, Pharmacy and SW  Financial Services: Medicare  Follow-up services arranged: Home Health: [facility]-PT,OT,RN,SPT, DME: [facility]-BSC,[facility] and Patient/Family has no preference for HH/DME agencies  Comments (or additional information):FAMILY EDUCATION COMPLETED-SON AND DAUGHTER IN-[place] ARE MOVING INTO THREE BEDROOM TO ACCOMMODATE PT LIVING WITH THEM. SET UP WITH PCP-DR AVBUERE.  SCAT APPLICATION COMPLETED AND FAXED IN.  Patient/Family verbalized understanding of follow-up arrangements: Yes  Individual responsible for coordination of the follow-up plan: ALVIN-SON  Confirmed correct DME delivered: Lucy Chris 05/08/2013    Lucy Chris

## 2013-06-12 ENCOUNTER — Inpatient Hospital Stay: Payer: Medicare Other | Admitting: Physical Medicine & Rehabilitation

## 2013-07-07 ENCOUNTER — Encounter: Payer: Self-pay | Admitting: Physical Medicine & Rehabilitation

## 2013-07-07 ENCOUNTER — Encounter: Payer: Medicare HMO | Attending: Physical Medicine & Rehabilitation

## 2013-07-07 ENCOUNTER — Ambulatory Visit (HOSPITAL_BASED_OUTPATIENT_CLINIC_OR_DEPARTMENT_OTHER): Payer: Medicare HMO | Admitting: Physical Medicine & Rehabilitation

## 2013-07-07 VITALS — BP 137/91 | HR 65 | Resp 14 | Ht 69.0 in | Wt 168.0 lb

## 2013-07-07 DIAGNOSIS — H53462 Homonymous bilateral field defects, left side: Secondary | ICD-10-CM

## 2013-07-07 DIAGNOSIS — H53469 Homonymous bilateral field defects, unspecified side: Secondary | ICD-10-CM

## 2013-07-07 DIAGNOSIS — G8110 Spastic hemiplegia affecting unspecified side: Secondary | ICD-10-CM | POA: Insufficient documentation

## 2013-07-07 DIAGNOSIS — E119 Type 2 diabetes mellitus without complications: Secondary | ICD-10-CM | POA: Insufficient documentation

## 2013-07-07 DIAGNOSIS — R413 Other amnesia: Secondary | ICD-10-CM | POA: Insufficient documentation

## 2013-07-07 NOTE — Progress Notes (Signed)
Subjective:    Patient ID: Ryan Brady, male    DOB: 1946-07-26, 67 y.o.   MRN: [id number]  HPI This is a 67 year old right-handed male  with history of CVA as well as diabetes mellitus, who was admitted on  April 24, 2013, with difficulty speaking. The patient was independent  prior to admission. MRI of the brain showed acute right MCA territory  infarct as well as bilateral chronic lacunar infarcts affecting the  right and left basal ganglia. MRA of the head with subtotal occlusion  left A1-ACA. Echocardiogram with ejection fraction of 70% grade 1  diastolic dysfunction without emboli. Carotid Dopplers with 0-39% right  ICA stenosis. Left ICA flow not visualized past the very proximal  portion. The patient did not receive tPA. Placed on Plavix therapy for  CVA prophylaxis   Has received a home health therapy after discharge from hospital No falls. Patient feels like he is back to his usual. Son feels that the patient has not returned to normal yet. Left hand weaker Voice quieter Still has memory issues  PCP [place] Pain Inventory Average Pain 0 Pain Right Now 0 My pain is no pain  In the last 24 hours, has pain interfered with the following? General activity 0 Relation with others 0 Enjoyment of life 0 What TIME of day is your pain at its worst? no pain Sleep (in general) Good  Pain is worse with: no pain Pain improves with: no pain Relief from Meds: no pain  Mobility walk without assistance  Function I need assistance with the following:  meal prep, household duties and shopping  Neuro/Psych bladder control problems bowel control problems weakness  Prior Studies Any changes since last visit?  no  Physicians involved in your care Any changes since last visit?  no   History reviewed. No pertinent family history. History   Social History  . Marital Status: Single    Spouse Name: N/A    Number of Children: N/A  . Years of Education: N/A   Social  History Main Topics  . Smoking status: Current Every Day Smoker  . Smokeless tobacco: Never Used  . Alcohol Use: No  . Drug Use: No  . Sexual Activity: None   Other Topics Concern  . None   Social History Narrative  . None   Past Surgical History  Procedure Laterality Date  . Tonsillectomy     Past Medical History  Diagnosis Date  . Stroke   . Diabetes mellitus without complication   . Hypertension    BP 137/91  Pulse 65  Resp 14  Ht 5\' 9"  (1.753 m)  Wt 168 lb (76.204 kg)  BMI 24.8 kg/m2  SpO2 96%    Review of Systems  Gastrointestinal:       Bowel Control Problems  Genitourinary:       Bladder control problems  Neurological: Positive for weakness.  All other systems reviewed and are negative.       Objective:   Physical Exam  Left upper extremity 4 minus/5 in the left deltoid, bicep, tricep, grip Decreased fine motor coordination left upper extremity. Positive dysdiadochokinesis left upper extremity. Mild dysmetria left upper extremity. Visual fields have left visual field cut. Ambulates without evidence of toe drag or knee instability       Assessment & Plan:  1. Right MCA infarct with left upper extremity weakness left field cut as well as short-term memory and concentration issues. Patient would benefit from additional therapy as an outpatient at  neuro rehabilitation, mainly OT and speech therapy Return to clinic in 6 weeks Followup neurology Followup with primary care physician

## 2013-07-07 NOTE — Patient Instructions (Addendum)
Outpatient therapy will call you to set up appt See me back for 6 weeks

## 2013-08-03 ENCOUNTER — Ambulatory Visit: Payer: Medicare Other | Admitting: Physical Medicine & Rehabilitation

## 2013-08-13 ENCOUNTER — Ambulatory Visit (HOSPITAL_BASED_OUTPATIENT_CLINIC_OR_DEPARTMENT_OTHER): Payer: Medicare HMO | Admitting: Physical Medicine & Rehabilitation

## 2013-08-13 ENCOUNTER — Encounter: Payer: Medicare PPO | Attending: Physical Medicine & Rehabilitation

## 2013-08-13 ENCOUNTER — Encounter: Payer: Self-pay | Admitting: Physical Medicine & Rehabilitation

## 2013-08-13 VITALS — BP 153/82 | HR 73 | Resp 14 | Ht 69.0 in | Wt 174.6 lb

## 2013-08-13 DIAGNOSIS — E119 Type 2 diabetes mellitus without complications: Secondary | ICD-10-CM | POA: Insufficient documentation

## 2013-08-13 DIAGNOSIS — H53469 Homonymous bilateral field defects, unspecified side: Secondary | ICD-10-CM | POA: Insufficient documentation

## 2013-08-13 DIAGNOSIS — G8110 Spastic hemiplegia affecting unspecified side: Secondary | ICD-10-CM | POA: Insufficient documentation

## 2013-08-13 DIAGNOSIS — R413 Other amnesia: Secondary | ICD-10-CM | POA: Insufficient documentation

## 2013-08-13 DIAGNOSIS — I69919 Unspecified symptoms and signs involving cognitive functions following unspecified cerebrovascular disease: Secondary | ICD-10-CM

## 2013-08-13 DIAGNOSIS — I6350 Cerebral infarction due to unspecified occlusion or stenosis of unspecified cerebral artery: Secondary | ICD-10-CM

## 2013-08-13 DIAGNOSIS — I639 Cerebral infarction, unspecified: Secondary | ICD-10-CM

## 2013-08-13 NOTE — Progress Notes (Signed)
  Subjective:    Patient ID: Ryan Brady, male    DOB: November 14, 1945, 67 y.o.   MRN: [id number]  HPI Patient's son notes short term memory problems. Long term memory is intact Patient is independent with self care and ambulation. He lives with his son. He is completed inpatient and home health therapy. Right MCA infarct onset 04/24/2013 Doesn't want to pursue any further outpatient therapy. Has had a couple episodes where he just froze. His son thinks that it's someone was not with him to support him he would have fallen. No shaking no loss of consciousness. Patient was talking during these episodes no pain. Pain Inventory Average Pain 0 Pain Right Now 0 My pain is no pain  In the last 24 hours, has pain interfered with the following? General activity 0 Relation with others 0 Enjoyment of life 0 What TIME of day is your pain at its worst? no pain Sleep (in general) Good  Pain is worse with: no pain Pain improves with: no pain Relief from Meds: no pain  Mobility walk without assistance  Function disabled: date disabled .  Neuro/Psych weakness  Prior Studies Any changes since last visit?  no  Physicians involved in your care Any changes since last visit?  no   History reviewed. No pertinent family history. History   Social History  . Marital Status: Single    Spouse Name: N/A    Number of Children: N/A  . Years of Education: N/A   Social History Main Topics  . Smoking status: Current Every Day Smoker  . Smokeless tobacco: Never Used  . Alcohol Use: No  . Drug Use: No  . Sexual Activity: None   Other Topics Concern  . None   Social History Narrative  . None   Past Surgical History  Procedure Laterality Date  . Tonsillectomy     Past Medical History  Diagnosis Date  . Stroke   . Diabetes mellitus without complication   . Hypertension    BP 153/82  Pulse 73  Resp 14  Ht 5\' 9"  (1.753 m)  Wt 174 lb 9.6 oz (79.198 kg)  BMI 25.77 kg/m2  SpO2  98%   Review of Systems  Neurological: Positive for weakness.  All other systems reviewed and are negative.       Objective:   Physical Exam Left upper extremity 4 minus/5 in the left deltoid, bicep, tricep, grip  Right upper extremity 5/5 in the deltoid, bicep, tricep, grip Decreased fine motor coordination left upper extremity.  Positive dysdiadochokinesis left upper extremity.  Mild dysmetria left upper extremity.  Visual fields have left visual field cut.  Ambulates without evidence of toe drag or knee instability        Assessment & Plan:  1. Right MCA infarct with residual mild left hemiparesis and field cut. He said good functional recovery but has remaining fine motor deficits as well as short term memory deficits. He would benefit from additional outpatient therapy however does not appear to be motivated.  2. The freezing episode x2. Does not appear to be seizure disorder. Does not appear to be TIA. I'm not sure whether this could represent some parkinsonism related to history of multiple infarcts. Advised followup with neurology  RTC 6 months

## 2013-08-13 NOTE — Patient Instructions (Signed)
You will need to see the neurologist Dr. Pearlean Brownie

## 2013-08-22 ENCOUNTER — Emergency Department (HOSPITAL_COMMUNITY): Payer: Medicare HMO

## 2013-08-22 ENCOUNTER — Emergency Department (HOSPITAL_COMMUNITY)
Admission: EM | Admit: 2013-08-22 | Discharge: 2013-08-22 | Disposition: A | Discharge status: Home / Self Care | Payer: Medicare HMO | Attending: Emergency Medicine | Admitting: Emergency Medicine

## 2013-08-22 ENCOUNTER — Encounter (HOSPITAL_COMMUNITY): Payer: Self-pay | Admitting: Emergency Medicine

## 2013-08-22 DIAGNOSIS — E119 Type 2 diabetes mellitus without complications: Secondary | ICD-10-CM | POA: Insufficient documentation

## 2013-08-22 DIAGNOSIS — I10 Essential (primary) hypertension: Secondary | ICD-10-CM | POA: Insufficient documentation

## 2013-08-22 DIAGNOSIS — Z79899 Other long term (current) drug therapy: Secondary | ICD-10-CM | POA: Insufficient documentation

## 2013-08-22 DIAGNOSIS — Z8673 Personal history of transient ischemic attack (TIA), and cerebral infarction without residual deficits: Secondary | ICD-10-CM | POA: Insufficient documentation

## 2013-08-22 DIAGNOSIS — F17200 Nicotine dependence, unspecified, uncomplicated: Secondary | ICD-10-CM | POA: Insufficient documentation

## 2013-08-22 DIAGNOSIS — R4182 Altered mental status, unspecified: Secondary | ICD-10-CM | POA: Insufficient documentation

## 2013-08-22 DIAGNOSIS — Z7902 Long term (current) use of antithrombotics/antiplatelets: Secondary | ICD-10-CM | POA: Insufficient documentation

## 2013-08-22 LAB — CBC
HCT: 38.6 % — ABNORMAL LOW (ref 39.0–52.0)
Hemoglobin: 13.1 g/dL (ref 13.0–17.0)
MCH: 30.6 pg (ref 26.0–34.0)
MCHC: 33.9 g/dL (ref 30.0–36.0)
RDW: 18.4 % — ABNORMAL HIGH (ref 11.5–15.5)

## 2013-08-22 LAB — URINALYSIS, ROUTINE W REFLEX MICROSCOPIC
Leukocytes, UA: NEGATIVE
Nitrite: NEGATIVE
Specific Gravity, Urine: 1.023 (ref 1.005–1.030)
pH: 6.5 (ref 5.0–8.0)

## 2013-08-22 LAB — COMPREHENSIVE METABOLIC PANEL
BUN: 12 mg/dL (ref 6–23)
Calcium: 9.3 mg/dL (ref 8.4–10.5)
GFR calc Af Amer: 69 mL/min — ABNORMAL LOW (ref >90)
Glucose, Bld: 84 mg/dL (ref 70–99)
Sodium: 136 mEq/L (ref 135–145)
Total Protein: 6.8 g/dL (ref 6.0–8.3)

## 2013-08-22 NOTE — ED Provider Notes (Signed)
CSN: [id number]     Arrival date & time 08/22/13  1216 History   First MD Initiated Contact with Patient 08/22/13 1228     Chief Complaint  Patient presents with  . Altered Mental Status   (Consider location/radiation/quality/duration/timing/severity/associated sxs/prior Treatment) Patient is a 67 y.o. male presenting with altered mental status. The history is provided by the patient and a relative.  Altered Mental Status Presenting symptoms: no confusion   Associated symptoms: no abdominal pain, no fever, no headaches, no rash, no vomiting and no weakness   pt with hx cva 7/14, presents w family. They indicates pt home by himself, is a smoker, had gone outside to smoke per his normal routine, but when they got back he was leaning against/hanging on stairway railing, and seemed to be staring off and drained appearing. Pt denies any c/o, however is limited historian given prior cva, baseline mental status. Pt denies headache. No cough or uri c/o. No cp or sob. No abd pain. Normal appetite. No nvd. No gu c/o. Denies injury or fall. Family notes episodes in past month where pt will 'freeze up' for a few seconds - no seizure activity or postictal period noted- has seen his doctor w same, who has given referral to neurology in coming week.     Past Medical History  Diagnosis Date  . Stroke   . Diabetes mellitus without complication   . Hypertension    Past Surgical History  Procedure Laterality Date  . Tonsillectomy     No family history on file. History  Substance Use Topics  . Smoking status: Current Every Day Smoker  . Smokeless tobacco: Never Used  . Alcohol Use: No    Review of Systems  Constitutional: Negative for fever and chills.  HENT: Negative for sore throat.   Eyes: Negative for visual disturbance.  Respiratory: Negative for shortness of breath.   Cardiovascular: Negative for chest pain.  Gastrointestinal: Negative for vomiting, abdominal pain and diarrhea.   Genitourinary: Negative for dysuria and flank pain.  Musculoskeletal: Negative for back pain and neck pain.  Skin: Negative for rash.  Neurological: Negative for weakness, numbness and headaches.  Hematological: Does not bruise/bleed easily.  Psychiatric/Behavioral: Negative for confusion.    Allergies  Review of patient's allergies indicates no known allergies.  Home Medications   Current Outpatient Rx  Name  Route  Sig  Dispense  Refill  . baclofen (LIORESAL) 10 MG tablet   Oral   Take 1 tablet (10 mg total) by mouth 4 (four) times daily as needed (hiccups).   30 each   0   . clopidogrel (PLAVIX) 75 MG tablet   Oral   Take 1 tablet (75 mg total) by mouth daily with breakfast.   30 tablet   1   . lisinopril (PRINIVIL,ZESTRIL) 40 MG tablet   Oral   Take 1 tablet (40 mg total) by mouth daily.   30 tablet   1   . PARoxetine (PAXIL) 20 MG tablet   Oral   Take 1 tablet (20 mg total) by mouth every morning.   30 tablet   1    BP 166/84  Pulse 73  Temp(Src) 98.7 F (37.1 C) (Oral)  Resp 20  SpO2 100% Physical Exam  Nursing note and vitals reviewed. Constitutional: He is oriented to person, place, and time. He appears well-developed and well-nourished. No distress.  HENT:  Head: Atraumatic.  Mouth/Throat: Oropharynx is clear and moist.  Eyes: Conjunctivae are normal. Pupils are equal,  round, and reactive to light.  Neck: Normal range of motion. Neck supple. No tracheal deviation present.  No bruit  Cardiovascular: Normal rate, regular rhythm, normal heart sounds and intact distal pulses.   Pulmonary/Chest: Effort normal and breath sounds normal. No accessory muscle usage. No respiratory distress.  Abdominal: Soft. Bowel sounds are normal. He exhibits no distension. There is no tenderness.  Musculoskeletal: Normal range of motion. He exhibits no edema and no tenderness.  Neurological: He is alert and oriented to person, place, and time. No cranial nerve deficit.   Awake and alert. Speech brief in content but clear. Family states current mental status c/w baseline. No pronator drift. Mild weakness LUE, 5/5 legs.    Skin: Skin is warm and dry.  Psychiatric: He has a normal mood and affect.    ED Course  Procedures (including critical care time)  Results for orders placed during the hospital encounter of 08/22/13  CBC      Result Value Range   WBC 6.9  4.0 - 10.5 K/uL   RBC 4.28  4.22 - 5.81 MIL/uL   Hemoglobin 13.1  13.0 - 17.0 g/dL   HCT 40.9 (*) 81.1 - 91.4 %   MCV 90.2  78.0 - 100.0 fL   MCH 30.6  26.0 - 34.0 pg   MCHC 33.9  30.0 - 36.0 g/dL   RDW 78.2 (*) 95.6 - 21.3 %   Platelets 277  150 - 400 K/uL  COMPREHENSIVE METABOLIC PANEL      Result Value Range   Sodium 136  135 - 145 mEq/L   Potassium 4.0  3.5 - 5.1 mEq/L   Chloride 100  96 - 112 mEq/L   CO2 28  19 - 32 mEq/L   Glucose, Bld 84  70 - 99 mg/dL   BUN 12  6 - 23 mg/dL   Creatinine, Ser 0.86  0.50 - 1.35 mg/dL   Calcium 9.3  8.4 - 57.8 mg/dL   Total Protein 6.8  6.0 - 8.3 g/dL   Albumin 3.5  3.5 - 5.2 g/dL   AST 12  0 - 37 U/L   ALT 8  0 - 53 U/L   Alkaline Phosphatase 110  39 - 117 U/L   Total Bilirubin 0.4  0.3 - 1.2 mg/dL   GFR calc non Af Amer 60 (*) >90 mL/min   GFR calc Af Amer 69 (*) >90 mL/min  URINALYSIS, ROUTINE W REFLEX MICROSCOPIC      Result Value Range   Color, Urine YELLOW  YELLOW   APPearance CLEAR  CLEAR   Specific Gravity, Urine 1.023  1.005 - 1.030   pH 6.5  5.0 - 8.0   Glucose, UA NEGATIVE  NEGATIVE mg/dL   Hgb urine dipstick NEGATIVE  NEGATIVE   Bilirubin Urine NEGATIVE  NEGATIVE   Ketones, ur NEGATIVE  NEGATIVE mg/dL   Protein, ur NEGATIVE  NEGATIVE mg/dL   Urobilinogen, UA 1.0  0.0 - 1.0 mg/dL   Nitrite NEGATIVE  NEGATIVE   Leukocytes, UA NEGATIVE  NEGATIVE   Ct Head Wo Contrast  08/22/2013   CLINICAL DATA:  Altered mental status. Recent stroke.  EXAM: CT HEAD WITHOUT CONTRAST  TECHNIQUE: Contiguous axial images were obtained from the base  of the skull through the vertex without intravenous contrast.  COMPARISON:  04/25/2013 and 04/24/2013  FINDINGS: There is no evidence of intracranial hemorrhage, brain edema, or other signs of acute infarction. There is no evidence of intracranial mass lesion or mass effect. No  abnormal extraaxial fluid collections are identified.  A subacute to chronic lacunar infarct is seen in the right basal ganglia, as noted on recent MRI. Other old bilateral basal ganglia lacunes and chronic small vessel disease are also stable in appearance. Mild cerebral atrophy and ventriculomegaly are also unchanged.  IMPRESSION: No acute intracranial findings.  Subacute to chronic right basal ganglia lacunar infarct, as demonstrated on recent MRI. Other old basal ganglia lacunae, chronic small vessel disease, and cerebral atrophy are also stable in appearance.   Electronically Signed   By: Myles Rosenthal M.D.   On: 08/22/2013 13:34     EKG Interpretation   None       MDM   Labs. Ct.  Reviewed nursing notes and prior charts for additional history.   Ct and labs unremarkable for acute process.   Pt denies any c/o, states feels fine.   Family reports mental status currently at baseline.  Family reports pt already has referral to neurology - close follow up.  Pt appears stable for d/c.   Suzi Roots, MD 08/22/13 [phone]

## 2013-08-22 NOTE — ED Notes (Signed)
Pt. was in CT scan, will try when he is back in room

## 2013-08-22 NOTE — ED Notes (Signed)
Pt here with his family, they report pt was at home by himself, when family got there pt was "very out of it", staring off, and appeared "drained". Hx of stroke

## 2013-10-09 ENCOUNTER — Ambulatory Visit: Payer: Medicare Other | Admitting: Internal Medicine

## 2013-12-24 ENCOUNTER — Ambulatory Visit (INDEPENDENT_AMBULATORY_CARE_PROVIDER_SITE_OTHER): Payer: Medicare HMO | Admitting: Physician Assistant

## 2013-12-24 ENCOUNTER — Encounter: Payer: Self-pay | Admitting: Physician Assistant

## 2013-12-24 VITALS — BP 122/70 | HR 78 | Temp 98.8°F | Resp 16 | Wt 175.0 lb

## 2013-12-24 DIAGNOSIS — F329 Major depressive disorder, single episode, unspecified: Secondary | ICD-10-CM

## 2013-12-24 DIAGNOSIS — Z1211 Encounter for screening for malignant neoplasm of colon: Secondary | ICD-10-CM

## 2013-12-24 DIAGNOSIS — I10 Essential (primary) hypertension: Secondary | ICD-10-CM

## 2013-12-24 DIAGNOSIS — Z0000 Encounter for general adult medical examination without abnormal findings: Secondary | ICD-10-CM

## 2013-12-24 DIAGNOSIS — I6350 Cerebral infarction due to unspecified occlusion or stenosis of unspecified cerebral artery: Secondary | ICD-10-CM

## 2013-12-24 DIAGNOSIS — I6330 Cerebral infarction due to thrombosis of unspecified cerebral artery: Secondary | ICD-10-CM

## 2013-12-24 DIAGNOSIS — I639 Cerebral infarction, unspecified: Secondary | ICD-10-CM

## 2013-12-24 DIAGNOSIS — F3289 Other specified depressive episodes: Secondary | ICD-10-CM

## 2013-12-24 DIAGNOSIS — R351 Nocturia: Secondary | ICD-10-CM

## 2013-12-24 DIAGNOSIS — M549 Dorsalgia, unspecified: Secondary | ICD-10-CM

## 2013-12-24 DIAGNOSIS — H539 Unspecified visual disturbance: Secondary | ICD-10-CM

## 2013-12-24 DIAGNOSIS — F32A Depression, unspecified: Secondary | ICD-10-CM

## 2013-12-24 NOTE — Patient Instructions (Signed)
It was great meeting you today Ryan Brady!  Labs have been ordered for you, when you report to lab please be fasting.    Back Pain, Adult Back pain is very common. The pain often gets better over time. The cause of back pain is usually not dangerous. Most people can learn to manage their back pain on their own.  HOME CARE   Stay active. Start with short walks on flat ground if you can. Try to walk farther each day.  Do not sit, drive, or stand in one place for more than 30 minutes. Do not stay in bed.  Do not avoid exercise or work. Activity can help your back heal faster.  Be careful when you bend or lift an object. Bend at your knees, keep the object close to you, and do not twist.  Sleep on a firm mattress. Lie on your side, and bend your knees. If you lie on your back, put a pillow under your knees.  Only take medicines as told by your doctor.  Put ice on the injured area.  Put ice in a plastic bag.  Place a towel between your skin and the bag.  Leave the ice on for 15-20 minutes, 03-04 times a day for the first 2 to 3 days. After that, you can switch between ice and heat packs.  Ask your doctor about back exercises or massage.  Avoid feeling anxious or stressed. Find good ways to deal with stress, such as exercise. GET HELP RIGHT AWAY IF:   Your pain does not go away with rest or medicine.  Your pain does not go away in 1 week.  You have new problems.  You do not feel well.  The pain spreads into your legs.  You cannot control when you poop (bowel movement) or pee (urinate).  Your arms or legs feel weak or lose feeling (numbness).  You feel sick to your stomach (nauseous) or throw up (vomit).  You have belly (abdominal) pain.  You feel like you may pass out (faint). MAKE SURE YOU:   Understand these instructions.  Will watch your condition.  Will get help right away if you are not doing well or get worse. Document Released: 03/26/2008 Document Revised:  12/31/2011 Document Reviewed: 02/26/2011 [facility] Patient Information 2014 [place], [place]. Depression, Adult Depression is feeling sad, low, down in the dumps, blue, gloomy, or empty. In general, there are two kinds of depression:  Normal sadness or grief. This can happen after something upsetting. It often goes away on its own within 2 weeks. After losing a loved one (bereavement), normal sadness and grief may last longer than two weeks. It usually gets better with time.  Clinical depression. This kind lasts longer than normal sadness or grief. It keeps you from doing the things you normally do in life. It is often hard to function at home, work, or at school. It may affect your relationships with others. Treatment is often needed. GET HELP RIGHT AWAY IF:  You have thoughts about hurting yourself or others.  You lose touch with reality (psychotic symptoms). You may:  See or hear things that are not real.  Have untrue beliefs about your life or people around you.  Your medicine is giving you problems. MAKE SURE YOU:  Understand these instructions.  Will watch your condition.  Will get help right away if you are not doing well or get worse. Document Released: 11/10/2010 Document Revised: 07/02/2012 Document Reviewed: 02/07/2012 [employer] Patient Information 2014 [place]. Hypertension Hypertension is another name for high blood pressure. High blood pressure may mean that your heart needs to work harder to pump blood. Blood pressure consists of two numbers, which includes a higher number over a lower number (example: 110/72). HOME CARE   Make lifestyle changes as told by your doctor. This may include weight loss and exercise.  Take your blood pressure medicine every day.  Limit how much salt you use.  Stop smoking if you smoke.  Do not use drugs.  Talk to your doctor if you are using decongestants or birth control pills. These medicines might make blood pressure  higher.  Females should not drink more than 1 alcoholic drink per day. Males should not drink more than 2 alcoholic drinks per day.  See your doctor as told. GET HELP RIGHT AWAY IF:   You have a blood pressure reading with a top number of 180 or higher.  You get a very bad headache.  You get blurred or changing vision.  You feel confused.  You feel weak, numb, or faint.  You get chest or belly (abdominal) pain.  You throw up (vomit).  You cannot breathe very well. MAKE SURE YOU:   Understand these instructions.  Will watch your condition.  Will get help right away if you are not doing well or get worse. Document Released: 03/26/2008 Document Revised: 12/31/2011 Document Reviewed: 03/26/2008 [facility] Patient Information 2014 [place], [place]. Health Maintenance, Males A healthy lifestyle and preventative care can promote health and wellness.  Maintain regular health, dental, and eye exams.  Eat a healthy diet. Foods like vegetables, fruits, whole grains, low-fat dairy products, and lean protein foods contain the nutrients you need and are low in calories. Decrease your intake of foods high in solid fats, added sugars, and salt. Get information about a proper diet from your health care provider, if necessary.  Regular physical exercise is one of the most important things you can do for your health. Most adults should get at least 150 minutes of moderate-intensity exercise (any activity that increases your heart rate and causes you to sweat) each week. In addition, most adults need muscle-strengthening exercises on 2 or more days a week.   Maintain a healthy weight. The body mass index (BMI) is a screening tool to identify possible weight problems. It provides an estimate of body fat based on height and weight. Your health care provider can find your BMI and can help you achieve or maintain a healthy weight. For males 20 years and older:  A BMI below 18.5 is considered  underweight.  A BMI of 18.5 to 24.9 is normal.  A BMI of 25 to 29.9 is considered overweight.  A BMI of 30 and above is considered obese.  Maintain normal blood lipids and cholesterol by exercising and minimizing your intake of saturated fat. Eat a balanced diet with plenty of fruits and vegetables. Blood tests for lipids and cholesterol should begin at age 64 and be repeated every 5 years. If your lipid or cholesterol levels are high, you are over 50, or you are at high risk for heart disease, you may need your cholesterol levels checked more frequently.Ongoing high lipid and cholesterol levels should be treated with medicines, if diet and exercise are not working.  If you smoke, find out from your health care provider how to quit. If you do not use tobacco, do not start.  Lung cancer screening is recommended for adults aged 50 80 years who are at  high risk for developing lung cancer because of a history of smoking. A yearly low-dose CT scan of the lungs is recommended for people who have at least a 30-pack-year history of smoking and are a current smoker or have quit within the past 15 years. A pack year of smoking is smoking an average of 1 pack of cigarettes a day for 1 year (for example, a 30-pack-year history of smoking could mean smoking 1 pack a day for 30 years or 2 packs a day for 15 years). Yearly screening should continue until the smoker has stopped smoking for at least 15 years. Yearly screening should be stopped for people who develop a health problem that would prevent them from having lung cancer treatment.  If you choose to drink alcohol, do not have more than 2 drinks per day. One drink is considered to be 12 oz (360 mL) of beer, 5 oz (150 mL) of wine, or 1.5 oz (45 mL) of liquor.  Avoid use of street drugs. Do not share needles with anyone. Ask for help if you need support or instructions about stopping the use of drugs.  High blood pressure causes heart disease and increases  the risk of stroke. Blood pressure should be checked at least every 1 2 years. Ongoing high blood pressure should be treated with medicines if weight loss and exercise are not effective.  If you are 85 70 years old, ask your health care provider if you should take aspirin to prevent heart disease.  Diabetes screening involves taking a blood sample to check your fasting blood sugar level. This should be done once every 3 years after age 62, if you are at a normal weight and without risk factors for diabetes. Testing should be considered at a younger age or be carried out more frequently if you are overweight and have at least 1 risk factor for diabetes.  Colorectal cancer can be detected and often prevented. Most routine colorectal cancer screening begins at the age of 68 and continues through age 73. However, your health care provider may recommend screening at an earlier age if you have risk factors for colon cancer. On a yearly basis, your health care provider may provide home test kits to check for hidden blood in the stool. A small camera at the end of a tube may be used to directly examine the colon (sigmoidoscopy or colonoscopy) to detect the earliest forms of colorectal cancer. Talk to your health care provider about this at age 68, when routine screening begins. A direct exam of the colon should be repeated every 5 10 years through age 45, unless early forms of pre-cancerous polyps or small growths are found.  People who are at an increased risk for hepatitis B should be screened for this virus. You are considered at high risk for hepatitis B if:  You were born in a country where hepatitis B occurs often. Talk with your health care provider about which countries are considered high-risk.  Your parents were born in a high-risk country and you have not received a shot to protect against hepatitis B (hepatitis B vaccine).  You have HIV or AIDS.  You use needles to inject street drugs.  You  live with, or have sex with, someone who has hepatitis B.  You are a man who has sex with other men (MSM).  You get hemodialysis treatment.  You take certain medicines for conditions like cancer, organ transplantation, and autoimmune conditions.  Hepatitis C blood testing is  recommended for all people born from [id number] through 1967 and any individual with known risk factors for hepatitis C.  Healthy men should no longer receive prostate-specific antigen (PSA) blood tests as part of routine cancer screening. Talk to your health care provider about prostate cancer screening.  Testicular cancer screening is not recommended for adolescents or adult males who have no symptoms. Screening includes self-exam, a health care provider exam, and other screening tests. Consult with your health care provider about any symptoms you have or any concerns you have about testicular cancer.  Practice safe sex. Use condoms and avoid high-risk sexual practices to reduce the spread of sexually transmitted infections (STIs).  Use sunscreen. Apply sunscreen liberally and repeatedly throughout the day. You should seek shade when your shadow is shorter than you. Protect yourself by wearing long sleeves, pants, a wide-brimmed hat, and sunglasses year round, whenever you are outdoors.  Tell your health care provider of new moles or changes in moles, especially if there is a change in shape or color. Also tell your provider if a mole is larger than the size of a pencil eraser.  A one-time screening for abdominal aortic aneurysm (AAA) and surgical repair of large AAAs by ultrasound is recommended for men aged 70 75 years who are current or former smokers.  Stay current with your vaccines (immunizations). Document Released: 04/05/2008 Document Revised: 07/29/2013 Document Reviewed: 03/05/2011 [facility] Patient Information 2014 [place].

## 2013-12-24 NOTE — Progress Notes (Signed)
Pre visit review using our clinic review tool, if applicable. No additional management support is needed unless otherwise documented below in the visit note. 

## 2013-12-27 NOTE — Assessment & Plan Note (Signed)
Stroke, history of Continue on Plavix 75 mg one tab daily with breakfast Keep scheduled follow up appointment with Neurology

## 2013-12-27 NOTE — Assessment & Plan Note (Signed)
Continue on Paxil 20 mg one tab

## 2013-12-27 NOTE — Progress Notes (Signed)
Patient ID: Lenise Arenalbert Geimer is a 68 y.o. male DOB: [phone] MRN: [id number]     HPI:  Patient is a 68 year old male who presents to the office to establish care. Patient is brought to the office by his son who acts as primary historian. Son reports patient has a significant history of strokes, having had 9 major strokes with minimal residual symptoms. Last stroke was 04/2013. Son reports patient is followed by a "stroke specialist" and has an appointment coming up. Patient is on Plavix 75 mg once daily. History of HTN treated with Lisiopril 40 mg one tab daily, depression treated with Paxil 20 mg one tablet daily. Also report history of persistent hiccups treated with Baclofen 10 mg four times daily as needed for hiccups. Reports occasional back pains that resolve with Tylenol. Patient is a long time smoker with no interest in quitting. Reports he only smokes outside of the house. Also reports he is up 3-4 times a night to urinate. Also reports has lost vision in left eye progressively over the last years.  Denies chest pain/palpitations, SOB, changes in his normal cough, change in bowel/bladder habits, no blood in urine/stool, no black/tarry stool. Denies N/V/F/C, HA, saddle anesthesia, radiation of back pain, weakness or extremity swelling.    Influenza: no Pneumonia:7/14 Tetanus:unknown Eye Dr. Has not been seen Dentist not for long time Colonoscopy: never  ROS: As stated in HPI. All other systems negative Activities of Daily Living  In your present state of health, do you have any difficulty performing the following activities? Preparing food and eating?: No  Bathing yourself: No  Getting dressed: No  Using the toilet:No  Moving around from place to place: No     Home Safety: Has smoke detector and wears seat belts. No firearms. No excess sun exposure.  Diet and Exercise  Current exercise habits: does not exercise Dietary issues discussed: healthy diet     Past Medical History    Diagnosis Date  . Stroke   . Diabetes mellitus without complication   . Hypertension   . Arthritis   . Depression   . Glaucoma   . Allergy    Family History  Problem Relation Age of Onset  . Cancer Mother   . Hypertension Mother   . Cancer Father    History   Social History  . Marital Status: Single    Spouse Name: N/A    Number of Children: N/A  . Years of Education: N/A   Social History Main Topics  . Smoking status: Current Every Day Smoker  . Smokeless tobacco: Never Used  . Alcohol Use: No  . Drug Use: No  . Sexual Activity: None   Other Topics Concern  . None   Social History Narrative  . None   Past Surgical History  Procedure Laterality Date  . Tonsillectomy     Current Outpatient Prescriptions on File Prior to Visit  Medication Sig Dispense Refill  . baclofen (LIORESAL) 10 MG tablet Take 1 tablet (10 mg total) by mouth 4 (four) times daily as needed (hiccups).  30 each  0  . clopidogrel (PLAVIX) 75 MG tablet Take 1 tablet (75 mg total) by mouth daily with breakfast.  30 tablet  1  . lisinopril (PRINIVIL,ZESTRIL) 40 MG tablet Take 1 tablet (40 mg total) by mouth daily.  30 tablet  1  . PARoxetine (PAXIL) 20 MG tablet Take 1 tablet (20 mg total) by mouth every morning.  30 tablet  1  .  Zinc Gluconate (COLD-EEZE) 13.3 MG LOZG Use as directed 1 lozenge in the mouth or throat every 6 (six) hours as needed.       No current facility-administered medications on file prior to visit.   No Known Allergies  PE:  Filed Vitals:   12/24/13 1439  BP: 122/70  Pulse: 78  Temp: 98.8 F (37.1 C)  Resp: 16    CONSTITUTIONAL: Well developed, well nourished, pleasant, quite, appears stated age, in NAD HEENT: normocephalic, atraumatic, bilateral ext/int canals normal. Significant amount of cerumen bilateral, still able to visualize TM's.  Bilateral TM's without injections, bulging, erythema.  Nose normal, uvula midline, oropharynx clear and moist. Poor dentition  throughout, multiple teeth extracted.  EYES: right pupil round and reacts to light, EOM intact. Left eye nonreactive to light. Conjunctiva without icterus. NECK: FROM, supple, without thyromegaly or mass, no carotid bruits noted CARDIO: RRR, normal S1 and S2, distal pulses intact. No extremity edema noted. PULM/CHEST CTA bilateral, no wheezes, rales or rhonchi. Non tender. ABD: appearance normal, soft, nontender. Normal bowel sounds x 4 quadrants, non palpable kidney, liver, spleen.  GU: deferred.  MUSC: FROM U/LE grossly intact. Adequate ROM of thoracic and lumbar spine. No midline tenderness noted.  LYMPH: no cervical, supraclavicular adenopathy NEURO: alert and oriented x 3, no cranial nerve deficit, motor strength and coordination NL. Gait is slowed but normal. SKIN: warm, dry, no rash or lesions noted. PSYCH: Mood and affect normal, speech normal.   Lab Results  Component Value Date   WBC 6.9 08/22/2013   HGB 13.1 08/22/2013   HCT 38.6* 08/22/2013   PLT 277 08/22/2013   GLUCOSE 84 08/22/2013   CHOL 151 04/25/2013   TRIG 84 04/25/2013   HDL 42 04/25/2013   LDLCALC 92 04/25/2013   ALT 8 08/22/2013   AST 12 08/22/2013   NA 136 08/22/2013   K 4.0 08/22/2013   CL 100 08/22/2013   CREATININE 1.20 08/22/2013   BUN 12 08/22/2013   CO2 28 08/22/2013   INR 1.08 04/24/2013   HGBA1C 5.6 04/25/2013     ASSESSMENT and PLAN   CPX/v70.0 - Patient has been counseled on age-appropriate routine health concerns for screening and prevention. These are reviewed and up-to-date. Immunizations are up-to-date or declined. Labs ordered and will be reviewed.  HM: Referral for colonoscopy  Nocturia: PSA today  HTN: Continue on current medication Lisinopril 40 mg one tab daily Labs today  Stroke, history of Continue on Plavix 75 mg one tab daily with breakfast Keep scheduled follow up appointment with Neurology  Depression:  Continue on Paxil 20 mg one tab daily  Vision changes: Referral to  ophthalmology

## 2013-12-27 NOTE — Assessment & Plan Note (Signed)
Continue on current medication Lisinopril 40 mg one tab daily Labs today

## 2013-12-28 ENCOUNTER — Other Ambulatory Visit: Payer: Self-pay | Admitting: *Deleted

## 2013-12-28 MED ORDER — LISINOPRIL 40 MG PO TABS: 40.0000 mg | ORAL_TABLET | Freq: Every day | ORAL | Status: DC

## 2013-12-28 MED ORDER — CLOPIDOGREL BISULFATE 75 MG PO TABS: 75.0000 mg | ORAL_TABLET | Freq: Every day | ORAL | Status: DC

## 2013-12-28 MED ORDER — PAROXETINE HCL 20 MG PO TABS: 20.0000 mg | ORAL_TABLET | ORAL | Status: DC

## 2013-12-31 ENCOUNTER — Encounter: Payer: Self-pay | Admitting: Internal Medicine

## 2014-01-15 ENCOUNTER — Other Ambulatory Visit (INDEPENDENT_AMBULATORY_CARE_PROVIDER_SITE_OTHER): Payer: Commercial Managed Care - HMO

## 2014-01-15 DIAGNOSIS — F32A Depression, unspecified: Secondary | ICD-10-CM

## 2014-01-15 DIAGNOSIS — F329 Major depressive disorder, single episode, unspecified: Secondary | ICD-10-CM

## 2014-01-15 DIAGNOSIS — I6330 Cerebral infarction due to thrombosis of unspecified cerebral artery: Secondary | ICD-10-CM

## 2014-01-15 DIAGNOSIS — Z0000 Encounter for general adult medical examination without abnormal findings: Secondary | ICD-10-CM

## 2014-01-15 DIAGNOSIS — Z1211 Encounter for screening for malignant neoplasm of colon: Secondary | ICD-10-CM

## 2014-01-15 DIAGNOSIS — M549 Dorsalgia, unspecified: Secondary | ICD-10-CM

## 2014-01-15 DIAGNOSIS — F3289 Other specified depressive episodes: Secondary | ICD-10-CM

## 2014-01-15 DIAGNOSIS — H539 Unspecified visual disturbance: Secondary | ICD-10-CM

## 2014-01-15 LAB — CBC WITH DIFFERENTIAL/PLATELET
Basophils Absolute: 0.1 10*3/uL (ref 0.0–0.1)
Basophils Relative: 0.7 % (ref 0.0–3.0)
EOS ABS: 0.3 10*3/uL (ref 0.0–0.7)
Eosinophils Relative: 2.4 % (ref 0.0–5.0)
HEMATOCRIT: 41.5 % (ref 39.0–52.0)
Hemoglobin: 13.6 g/dL (ref 13.0–17.0)
LYMPHS ABS: 4 10*3/uL (ref 0.7–4.0)
Lymphocytes Relative: 38.4 % (ref 12.0–46.0)
MCHC: 32.8 g/dL (ref 30.0–36.0)
MCV: 93.8 fl (ref 78.0–100.0)
MONO ABS: 1.2 10*3/uL — AB (ref 0.1–1.0)
Monocytes Relative: 11.3 % (ref 3.0–12.0)
NEUTROS PCT: 47.2 % (ref 43.0–77.0)
Neutro Abs: 4.9 10*3/uL (ref 1.4–7.7)
PLATELETS: 316 10*3/uL (ref 150.0–400.0)
RBC: 4.42 Mil/uL (ref 4.22–5.81)
RDW: 20.6 % — AB (ref 11.5–14.6)
WBC: 10.4 10*3/uL (ref 4.5–10.5)

## 2014-01-15 LAB — BASIC METABOLIC PANEL
BUN: 13 mg/dL (ref 6–23)
CHLORIDE: 104 meq/L (ref 96–112)
CO2: 30 meq/L (ref 19–32)
Calcium: 9.3 mg/dL (ref 8.4–10.5)
Creatinine, Ser: 1.3 mg/dL (ref 0.4–1.5)
GFR: 68.99 mL/min (ref >60.00)
Glucose, Bld: 93 mg/dL (ref 70–99)
POTASSIUM: 3.8 meq/L (ref 3.5–5.1)
SODIUM: 139 meq/L (ref 135–145)

## 2014-01-15 LAB — LIPID PANEL
CHOL/HDL RATIO: 5
CHOLESTEROL: 203 mg/dL — AB (ref 0–200)
HDL: 40.9 mg/dL (ref >39.00)
LDL Cholesterol: 145 mg/dL — ABNORMAL HIGH (ref 0–99)
Triglycerides: 84 mg/dL (ref 0.0–149.0)
VLDL: 16.8 mg/dL (ref 0.0–40.0)

## 2014-01-15 LAB — HEPATIC FUNCTION PANEL
ALT: 14 U/L (ref 0–53)
AST: 18 U/L (ref 0–37)
Albumin: 4 g/dL (ref 3.5–5.2)
Alkaline Phosphatase: 105 U/L (ref 39–117)
BILIRUBIN DIRECT: 0.1 mg/dL (ref 0.0–0.3)
BILIRUBIN TOTAL: 0.5 mg/dL (ref 0.3–1.2)
Total Protein: 7 g/dL (ref 6.0–8.3)

## 2014-01-15 LAB — TSH: TSH: 1.78 u[IU]/mL (ref 0.35–5.50)

## 2014-01-15 LAB — PSA: PSA: 5.18 ng/mL — ABNORMAL HIGH (ref 0.10–4.00)

## 2014-01-20 ENCOUNTER — Other Ambulatory Visit: Payer: Self-pay | Admitting: Internal Medicine

## 2014-01-20 DIAGNOSIS — E785 Hyperlipidemia, unspecified: Secondary | ICD-10-CM

## 2014-01-20 DIAGNOSIS — R972 Elevated prostate specific antigen [PSA]: Secondary | ICD-10-CM

## 2014-01-20 DIAGNOSIS — R351 Nocturia: Secondary | ICD-10-CM

## 2014-01-20 MED ORDER — PRAVASTATIN SODIUM 20 MG PO TABS: 20.0000 mg | ORAL_TABLET | Freq: Every day | ORAL | Status: DC

## 2014-01-25 ENCOUNTER — Encounter: Payer: Self-pay | Admitting: *Deleted

## 2014-02-09 ENCOUNTER — Ambulatory Visit: Payer: Medicare HMO | Admitting: Physical Medicine & Rehabilitation

## 2014-02-09 ENCOUNTER — Encounter: Payer: Medicare HMO | Attending: Physical Medicine & Rehabilitation

## 2014-02-22 ENCOUNTER — Telehealth: Payer: Self-pay | Admitting: *Deleted

## 2014-02-22 NOTE — Telephone Encounter (Signed)
02/22/2014   RE: Lenise Arenalbert Ellers DOB: 06/25/1946 MRN: [id number]   Dear Dr. Felicity CoyerLeschber,    We have scheduled the above patient for an endoscopic procedure. Our records show that he is on anticoagulation therapy.   Please advise as to how long the patient may come off his therapy of Plavix prior to the procedure, which is scheduled for 03-02-2014.  Please fax back/ or route the completed form to Baker Hughes IncorporatedKelly S, CMA.   Sincerely,    Arline AspKelly A Kihanna Kamiya

## 2014-02-22 NOTE — Telephone Encounter (Signed)
Pt should be scheduled with any PC provider to discuss risk/benefit of holding plavix for colo screening (prior pt of nancy's) I do not feel comfortable advising on holding plavix at this time given CVA last year without documentation of discussion of potential risk/benefit thanks

## 2014-02-24 ENCOUNTER — Encounter: Payer: Self-pay | Admitting: Nurse Practitioner

## 2014-02-24 ENCOUNTER — Ambulatory Visit (INDEPENDENT_AMBULATORY_CARE_PROVIDER_SITE_OTHER): Payer: Commercial Managed Care - HMO | Admitting: Nurse Practitioner

## 2014-02-24 VITALS — BP 152/104 | HR 72 | Ht 69.0 in | Wt 176.4 lb

## 2014-02-24 DIAGNOSIS — Z800 Family history of malignant neoplasm of digestive organs: Secondary | ICD-10-CM

## 2014-02-24 NOTE — Patient Instructions (Signed)
It has been recommended to you by your physician that you have a(n) Colonoscopy completed on Plavix. Per your request, we did not schedule the procedure(s) today. Please contact our office at [phone] should you decide to have the procedure completed.

## 2014-02-24 NOTE — Progress Notes (Signed)
Agree with Ms. Guenther's assessment and plan. Chris Cripps E. Lorilei Horan, MD, FACG   

## 2014-02-24 NOTE — Progress Notes (Signed)
HPI :  Patient is a 68 year old male, new to this practice, referred for colon cancer screening. Patient is on Plavix for a significant history of strokes, last one July 2014. Patient has no GI complaints such as bowel changes, blood in stool, weight loss or abdominal pain. He does have a strong family history of colon cancer in 4 brothers.     Past Medical History  Diagnosis Date  . Stroke     9 strokes  . Diabetes mellitus without complication   . Hypertension   . Arthritis   . Depression   . Glaucoma   . Allergy     Family History  Problem Relation Age of Onset  . Cancer Mother   . Hypertension Mother   . Cancer Father   . Colon cancer      4 brothers    History  Substance Use Topics  . Smoking status: Current Every Day Smoker  . Smokeless tobacco: Never Used  . Alcohol Use: No   Current Outpatient Prescriptions  Medication Sig Dispense Refill  . baclofen (LIORESAL) 10 MG tablet Take 1 tablet (10 mg total) by mouth 4 (four) times daily as needed (hiccups).  30 each  0  . clopidogrel (PLAVIX) 75 MG tablet Take 1 tablet (75 mg total) by mouth daily with breakfast.  30 tablet  6  . lisinopril (PRINIVIL,ZESTRIL) 40 MG tablet Take 1 tablet (40 mg total) by mouth daily.  30 tablet  6  . Multiple Vitamin (MULTI VITAMIN DAILY PO) Take 1 capsule by mouth every morning.      Marland Kitchen. PARoxetine (PAXIL) 20 MG tablet Take 1 tablet (20 mg total) by mouth every morning.  30 tablet  3  . pravastatin (PRAVACHOL) 20 MG tablet Take 1 tablet (20 mg total) by mouth daily.  90 tablet  3  . Zinc Gluconate (COLD-EEZE) 13.3 MG LOZG Use as directed 1 lozenge in the mouth or throat every 6 (six) hours as needed.       No current facility-administered medications for this visit.   No Known Allergies   Review of Systems: All systems reviewed and negative except where noted in HPI.    Physical Exam: BP 152/104  Pulse 72  Ht 5\' 9"  (1.753 m)  Wt 176 lb 6.4 oz (80.015 kg)  BMI 26.04  kg/m2 Constitutional: Pleasant,well-developed, black male in no acute distress. HEENT: Normocephalic and atraumatic. Conjunctivae are normal. No scleral icterus. Neurological: Alert and oriented to person place and time. Psychiatric: Normal mood and affect. Behavior is normal.   ASSESSMENT AND PLAN:   68 year old male for colon cancer screening. He has no GI complaints but  has a strong family history of colon cancer in 4 brothers. He is on Plavix for history of recurrent strokes. We discussed options:  We could proceed with a colonoscopy on Plavix but this could require require repeat colonoscopy (off Plavix) if polyps were found. Next option would be to have Neurology render opinion about his risks of holding Plavix for procedure. Finally, we discussed virtual colonoscopy with understanding that he may still require colonoscopy if polyps detected. Patient doesn't want to proceed with any of the options. Patient understands that he is at increased risk for colon cancer given family history. Son is present and doesn't necessarily agree with patient but supports his decision. I strongly advised patient to call us back if he changes mind or if he develops any symptoms of colon cancer.

## 2014-03-02 ENCOUNTER — Encounter: Payer: Medicare HMO | Admitting: Internal Medicine

## 2014-05-27 ENCOUNTER — Other Ambulatory Visit: Payer: Self-pay | Admitting: Physician Assistant

## 2014-05-28 ENCOUNTER — Other Ambulatory Visit: Payer: Self-pay | Admitting: *Deleted

## 2014-05-28 MED ORDER — PAROXETINE HCL 20 MG PO TABS: 20.0000 mg | ORAL_TABLET | ORAL | Status: DC

## 2014-10-07 ENCOUNTER — Other Ambulatory Visit: Payer: Self-pay | Admitting: Internal Medicine

## 2014-10-14 ENCOUNTER — Telehealth: Payer: Self-pay | Admitting: Physician Assistant

## 2014-10-14 NOTE — Telephone Encounter (Signed)
Daughter-in law called to state he is out of all medications, BP medications specifically and blood thinners. [place]. Daughter in law states pharmacy faxed these requests 10/08/14.  Gearlean AlfJanice Kite, [phone]

## 2014-10-25 ENCOUNTER — Other Ambulatory Visit: Payer: Self-pay | Admitting: Geriatric Medicine

## 2014-10-25 MED ORDER — LISINOPRIL 40 MG PO TABS: 40.0000 mg | ORAL_TABLET | Freq: Every day | ORAL | Status: DC

## 2014-10-26 ENCOUNTER — Other Ambulatory Visit: Payer: Self-pay | Admitting: *Deleted

## 2014-11-02 ENCOUNTER — Other Ambulatory Visit: Payer: Self-pay | Admitting: Internal Medicine

## 2015-01-14 IMAGING — CR DG CHEST 2V
2 series · 2 of 2 positions shown · non-contrast
Comparison: 04/24/2013

CLINICAL DATA: Stroke.  Diabetes

CHEST - 2 VIEW

[w chest lat]
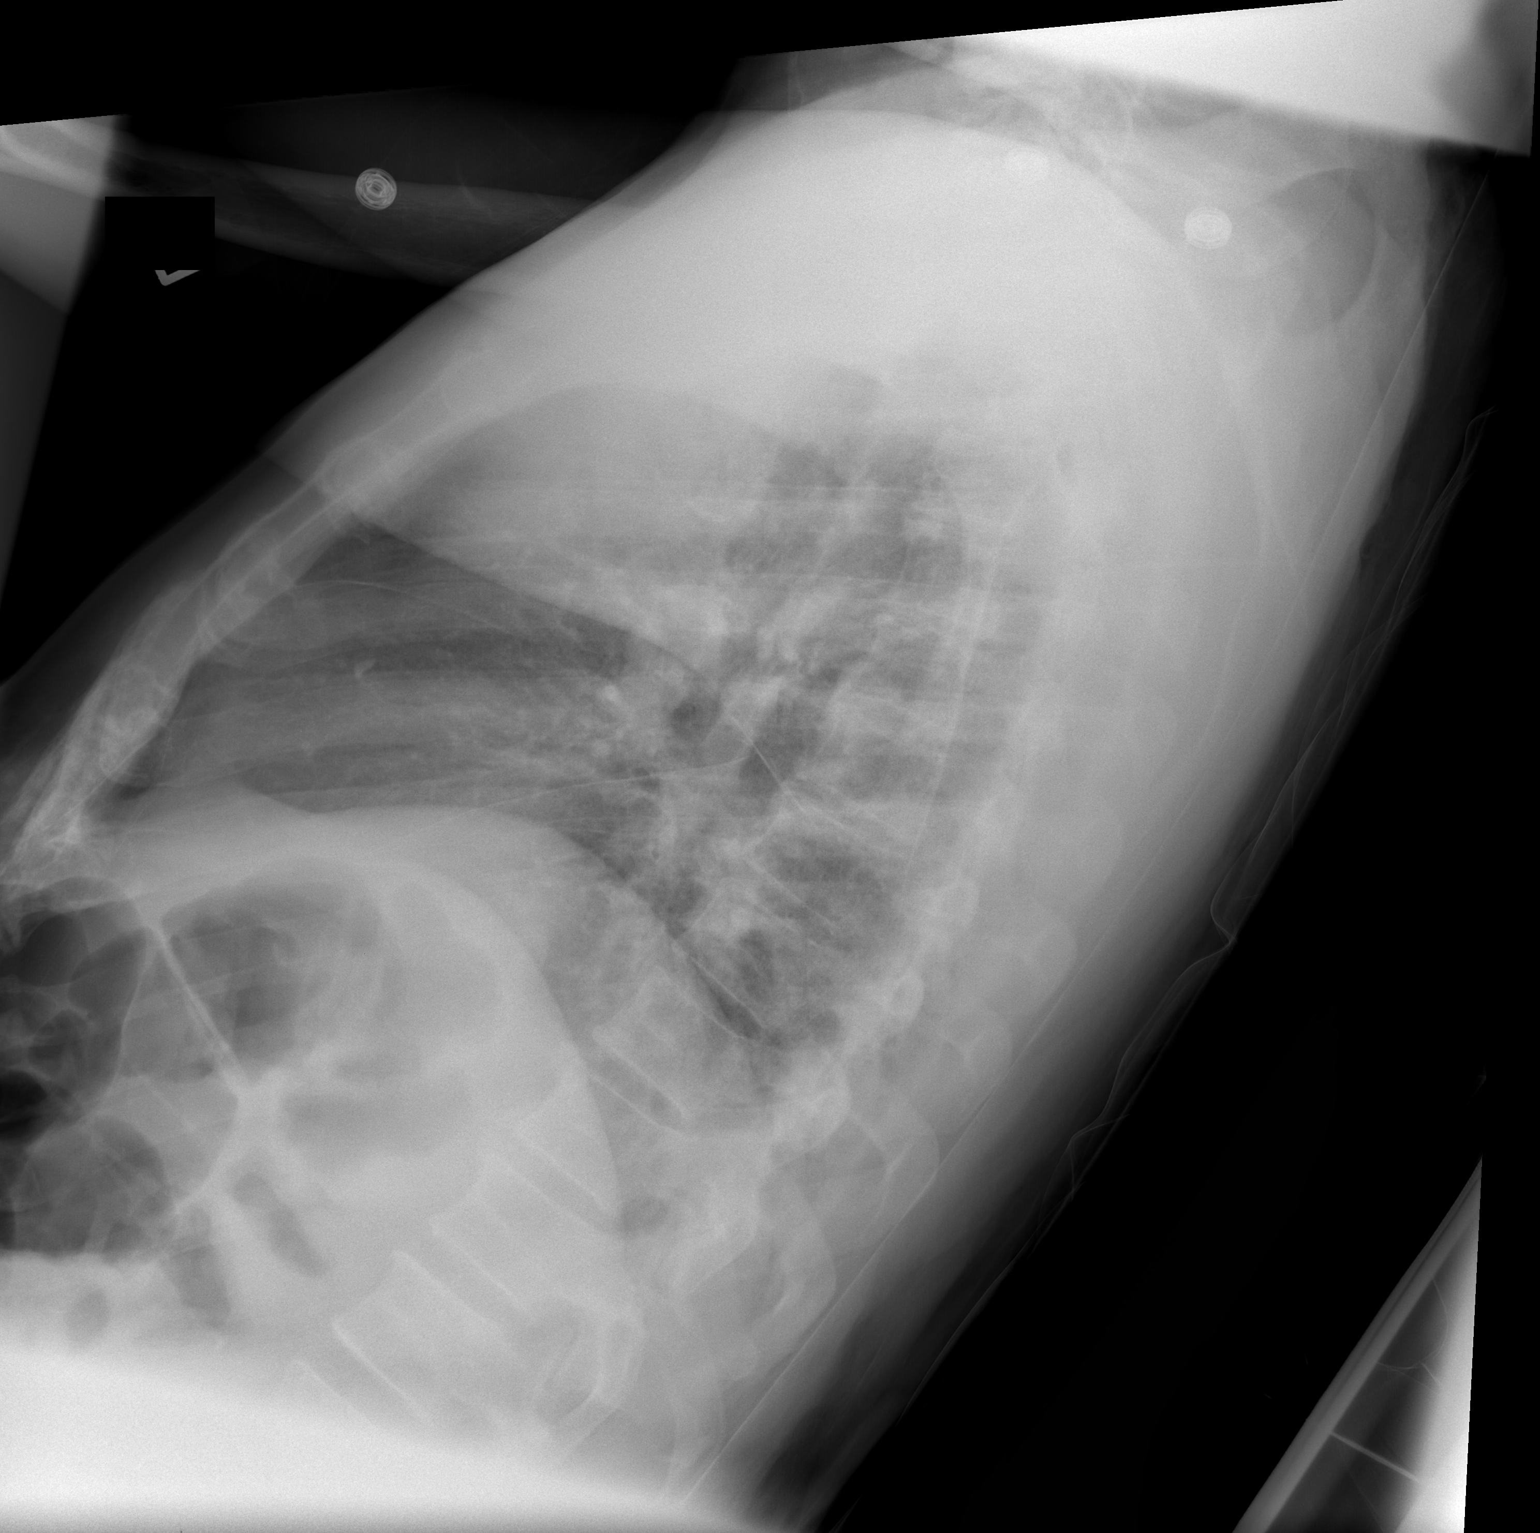

[view not recorded]
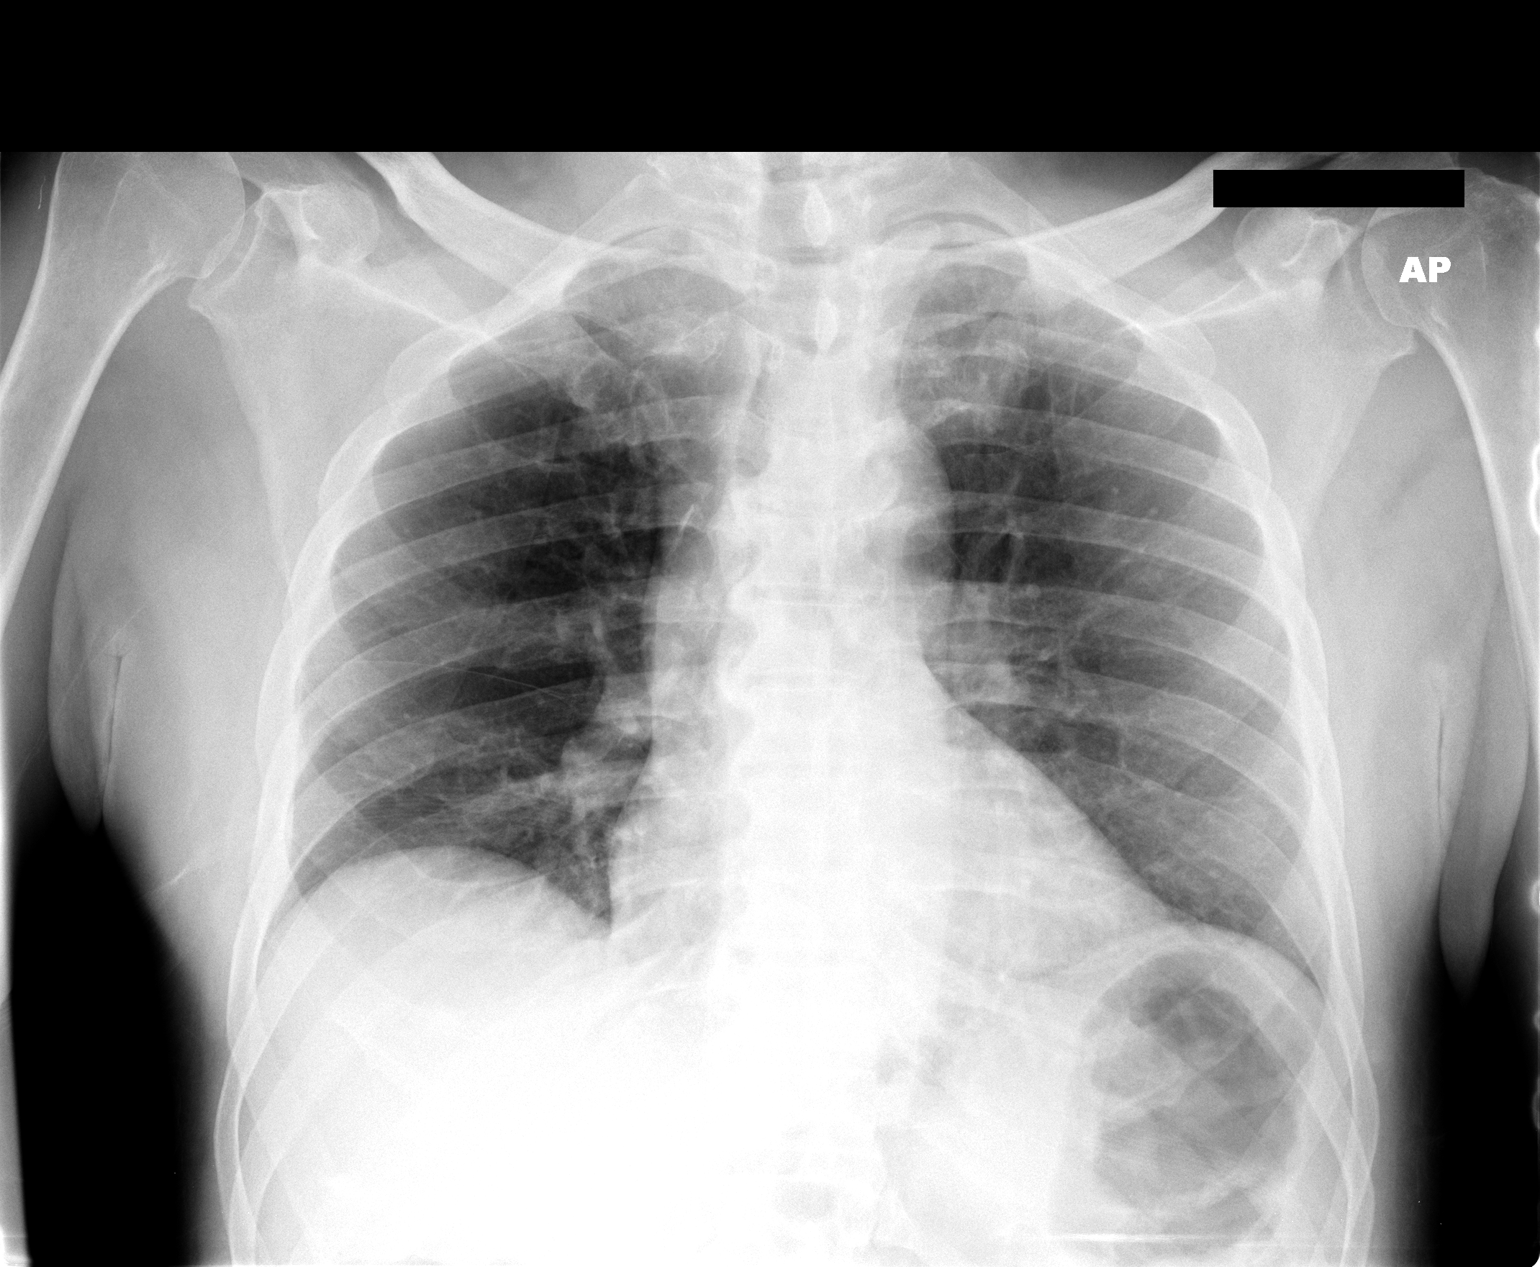

[2 of 2 positions shown; findings below may reference images not displayed]

FINDINGS: Normal heart size.  No pleural effusion or edema.  No
airspace consolidation noted.  Review of the visualized osseous
structures is unremarkable.
IMPRESSION: 1.  No acute cardiopulmonary abnormalities.

## 2015-01-31 DIAGNOSIS — I10 Essential (primary) hypertension: Secondary | ICD-10-CM | POA: Diagnosis not present

## 2015-01-31 DIAGNOSIS — E119 Type 2 diabetes mellitus without complications: Secondary | ICD-10-CM | POA: Diagnosis not present

## 2015-01-31 DIAGNOSIS — R531 Weakness: Secondary | ICD-10-CM | POA: Diagnosis not present

## 2015-03-15 ENCOUNTER — Other Ambulatory Visit: Payer: Self-pay | Admitting: Internal Medicine

## 2015-04-04 ENCOUNTER — Telehealth: Payer: Self-pay

## 2015-04-04 MED ORDER — PAROXETINE HCL 20 MG PO TABS: 20.0000 mg | ORAL_TABLET | ORAL | Status: DC

## 2015-04-04 NOTE — Telephone Encounter (Signed)
One month prescription written.

## 2015-04-04 NOTE — Telephone Encounter (Signed)
Received fax from pts pharmacy asking for a refill for Paxil. Has an appointment w/ you on 10/26/14. Please advise. You have never seen this pt.

## 2015-04-06 NOTE — Telephone Encounter (Signed)
Tried calling pt to let him know. Mail box was full.  

## 2015-04-26 ENCOUNTER — Encounter (INDEPENDENT_AMBULATORY_CARE_PROVIDER_SITE_OTHER): Payer: Self-pay

## 2015-04-26 ENCOUNTER — Ambulatory Visit (INDEPENDENT_AMBULATORY_CARE_PROVIDER_SITE_OTHER): Payer: Commercial Managed Care - HMO | Admitting: Family

## 2015-04-26 ENCOUNTER — Encounter: Payer: Self-pay | Admitting: Family

## 2015-04-26 VITALS — BP 152/104 | HR 90 | Temp 98.4°F | Resp 18 | Ht 69.0 in | Wt 176.8 lb

## 2015-04-26 DIAGNOSIS — F329 Major depressive disorder, single episode, unspecified: Secondary | ICD-10-CM

## 2015-04-26 DIAGNOSIS — F32A Depression, unspecified: Secondary | ICD-10-CM

## 2015-04-26 DIAGNOSIS — I10 Essential (primary) hypertension: Secondary | ICD-10-CM | POA: Diagnosis not present

## 2015-04-26 MED ORDER — AMLODIPINE BESYLATE 5 MG PO TABS: 5.0000 mg | ORAL_TABLET | Freq: Every day | ORAL | Status: DC

## 2015-04-26 MED ORDER — PAROXETINE HCL 30 MG PO TABS: 30.0000 mg | ORAL_TABLET | ORAL | Status: DC

## 2015-04-26 NOTE — Assessment & Plan Note (Signed)
Patient responded to PHQ responses which conflict with his current mood. He is tearful throughout the entire office visit and appears depressed secondary to the loss of his brother and sister both within the last year. Increase Paxil to 30 mg daily. Refer to psychology for counseling and cognitive behavioral therapy. Denies suicidal ideations. His son is his current caretaker. Follow-up in 2 weeks or sooner if symptoms worsen or fail to improve.

## 2015-04-26 NOTE — Progress Notes (Signed)
Pre visit review using our clinic review tool, if applicable. No additional management support is needed unless otherwise documented below in the visit note. 

## 2015-04-26 NOTE — Patient Instructions (Addendum)
Thank you for choosing [facility].  Summary/Instructions:  Please increase your Paxil to 30 mg daily and a referral has been placed to speak with a counselor.   Please start taking the amldopine for your blood pressure.   Your prescription(s) have been submitted to your pharmacy or been printed and provided for you. Please take as directed and contact our office if you believe you are having problem(s) with the medication(s) or have any questions.  If your symptoms worsen or fail to improve, please contact our office for further instruction, or in case of emergency go directly to the emergency room at the closest medical facility.   Depression Depression refers to feeling sad, low, down in the dumps, blue, gloomy, or empty. In general, there are two kinds of depression: 1. Normal sadness or normal grief. This kind of depression is one that we all feel from time to time after upsetting life experiences, such as the loss of a job or the ending of a relationship. This kind of depression is considered normal, is short lived, and resolves within a few days to 2 weeks. Depression experienced after the loss of a loved one (bereavement) often lasts longer than 2 weeks but normally gets better with time. 2. Clinical depression. This kind of depression lasts longer than normal sadness or normal grief or interferes with your ability to function at home, at work, and in school. It also interferes with your personal relationships. It affects almost every aspect of your life. Clinical depression is an illness. Symptoms of depression can also be caused by conditions other than those mentioned above, such as:  Physical illness. Some physical illnesses, including underactive thyroid gland (hypothyroidism), severe anemia, specific types of cancer, diabetes, uncontrolled seizures, heart and lung problems, strokes, and chronic pain are commonly associated with symptoms of depression.  Side effects of some  prescription medicine. In some people, certain types of medicine can cause symptoms of depression.  Substance abuse. Abuse of alcohol and illicit drugs can cause symptoms of depression. SYMPTOMS Symptoms of normal sadness and normal grief include the following:  Feeling sad or crying for short periods of time.  Not caring about anything (apathy).  Difficulty sleeping or sleeping too much.  No longer able to enjoy the things you used to enjoy.  Desire to be by oneself all the time (social isolation).  Lack of energy or motivation.  Difficulty concentrating or remembering.  Change in appetite or weight.  Restlessness or agitation. Symptoms of clinical depression include the same symptoms of normal sadness or normal grief and also the following symptoms:  Feeling sad or crying all the time.  Feelings of guilt or worthlessness.  Feelings of hopelessness or helplessness.  Thoughts of suicide or the desire to harm yourself (suicidal ideation).  Loss of touch with reality (psychotic symptoms). Seeing or hearing things that are not real (hallucinations) or having false beliefs about your life or the people around you (delusions and paranoia). DIAGNOSIS  The diagnosis of clinical depression is usually based on how bad the symptoms are and how long they have lasted. Your health care provider will also ask you questions about your medical history and substance use to find out if physical illness, use of prescription medicine, or substance abuse is causing your depression. Your health care provider may also order blood tests. TREATMENT  Often, normal sadness and normal grief do not require treatment. However, sometimes antidepressant medicine is given for bereavement to ease the depressive symptoms until they resolve.  The treatment for clinical depression depends on how bad the symptoms are but often includes antidepressant medicine, counseling with a mental health professional, or both.  Your health care provider will help to determine what treatment is best for you. Depression caused by physical illness usually goes away with appropriate medical treatment of the illness. If prescription medicine is causing depression, talk with your health care provider about stopping the medicine, decreasing the dose, or changing to another medicine. Depression caused by the abuse of alcohol or illicit drugs goes away when you stop using these substances. Some adults need professional help in order to stop drinking or using drugs. SEEK IMMEDIATE MEDICAL CARE IF:  You have thoughts about hurting yourself or others.  You lose touch with reality (have psychotic symptoms).  You are taking medicine for depression and have a serious side effect. FOR MORE INFORMATION  National Alliance on Mental Illness: www.nami.[employer] of Mental Health: http://www.maynard.net/ Document Released: 10/05/2000 Document Revised: 02/22/2014 Document Reviewed: 01/07/2012 [facility] Patient Information 2015 [place], [place]. This information is not intended to replace advice given to you by your health care provider. Make sure you discuss any questions you have with your health care provider.

## 2015-04-26 NOTE — Assessment & Plan Note (Signed)
Blood pressure remains above goal of 140/90 with current regimen of lisinopril. Continue current dosage of lisinopril. Start amlodipine. Monitor blood pressure at home as able. Follow up in 2 weeks for reevaluation and basic metabolic panel to check kidney function.

## 2015-04-26 NOTE — Progress Notes (Signed)
Subjective:    Patient ID: Ryan Brady, male    DOB: 03-27-1946, 69 y.o.   MRN: [id number]  Chief Complaint  Patient presents with  . Establish Care    Refill of paxil    HPI:  Ryan Brady is a 69 y.o. male with a PMH of allergies, arthritis, depression, diabetes,  who prese glaucoma, hypertension, and strokents tfor an office visit to establish care. His son is present for today's visit and is the major historian.   1.) Depression -  Previously diagnosed with depression and is maintained on Paxil.  Indicates that he takes the medications as prescribed and denies adverse side effects. Indicates that the medicine does not help very much. Most recently lost 2 of his siblings one about 3 months ago in an MVC and one about 8 months ago to cancer. Indicates that he has been tearful and sad recently. Denies suicidal ideations.   2.) Hypertension - Currently maintained on lisinopril. Indicates that he takes his medications as prescribed and denies adverse side effects. Does not currently take his blood pressure at home. Has had vision changes in his right eye secondary to his previous stroke.   BP Readings from Last 3 Encounters:  04/26/15 152/104  02/24/14 152/104  12/24/13 122/70    No Known Allergies   Current Outpatient Prescriptions on File Prior to Visit  Medication Sig Dispense Refill  . baclofen (LIORESAL) 10 MG tablet Take 1 tablet (10 mg total) by mouth 4 (four) times daily as needed (hiccups). 30 each 0  . clopidogrel (PLAVIX) 75 MG tablet Take 1 tablet (75 mg total) by mouth daily with breakfast. 30 tablet 6  . lisinopril (PRINIVIL,ZESTRIL) 40 MG tablet Take 1 tablet (40 mg total) by mouth daily. 30 tablet 6  . Multiple Vitamin (MULTI VITAMIN DAILY PO) Take 1 capsule by mouth every morning.    . pravastatin (PRAVACHOL) 20 MG tablet TAKE ONE TABLET BY MOUTH ONCE DAILY 90 tablet 0  . Zinc Gluconate (COLD-EEZE) 13.3 MG LOZG Use as directed 1 lozenge in the mouth or throat  every 6 (six) hours as needed.     No current facility-administered medications on file prior to visit.    Review of Systems  Respiratory: Negative for chest tightness and shortness of breath.   Cardiovascular: Negative for chest pain, palpitations and leg swelling.  Neurological: Negative for headaches.  Psychiatric/Behavioral: Positive for dysphoric mood. Negative for suicidal ideas.      Objective:    BP 152/104 mmHg  Pulse 90  Temp(Src) 98.4 F (36.9 C) (Oral)  Resp 18  Ht  (1.753 m)  Wt 176 lb 12.8 oz (80.196 kg)  BMI 26.10 kg/m2  SpO2 96% Nursing note and vital signs reviewed.  Depression screen PHQ 2/9 04/26/2015  Decreased Interest 0  Down, Depressed, Hopeless 0  PHQ - 2 Score 0    Physical Exam  Constitutional: He is oriented to person, place, and time. He appears well-developed and well-nourished. No distress.  Cardiovascular: Normal rate, regular rhythm, normal heart sounds and intact distal pulses.   Pulmonary/Chest: Effort normal and breath sounds normal.  Neurological: He is alert and oriented to person, place, and time.  Skin: Skin is warm and dry.  Psychiatric: Judgment and thought content normal. He exhibits a depressed mood.  Tearful with multiple episodes of crying.       Assessment & Plan:   Problem List Items Addressed This Visit      Cardiovascular and Mediastinum  Accelerated hypertension    Blood pressure remains above goal of 140/90 with current regimen of lisinopril. Continue current dosage of lisinopril. Start amlodipine. Monitor blood pressure at home as able. Follow up in 2 weeks for reevaluation and basic metabolic panel to check kidney function.      Relevant Medications   amLODipine (NORVASC) 5 MG tablet     Other   Depression - Primary    Patient responded to PHQ responses which conflict with his current mood. He is tearful throughout the entire office visit and appears depressed secondary to the loss of his brother and  sister both within the last year. Increase Paxil to 30 mg daily. Refer to psychology for counseling and cognitive behavioral therapy. Denies suicidal ideations. His son is his current caretaker. Follow-up in 2 weeks or sooner if symptoms worsen or fail to improve.      Relevant Medications   PARoxetine (PAXIL) 30 MG tablet   Other Relevant Orders   Ambulatory referral to Psychology

## 2015-05-10 ENCOUNTER — Ambulatory Visit: Payer: Commercial Managed Care - HMO | Admitting: Family

## 2015-05-10 DIAGNOSIS — Z0289 Encounter for other administrative examinations: Secondary | ICD-10-CM

## 2015-05-11 ENCOUNTER — Telehealth: Payer: Self-pay | Admitting: Family

## 2015-05-11 NOTE — Telephone Encounter (Signed)
Ok to reschedule if pt calls. 2 no shows left

## 2015-05-11 NOTE — Telephone Encounter (Signed)
Noted  

## 2015-05-11 NOTE — Telephone Encounter (Signed)
Patient no showed for a 2 week fu on 7/19.  Please advise.

## 2015-06-29 ENCOUNTER — Other Ambulatory Visit: Payer: Self-pay | Admitting: Family

## 2015-07-19 ENCOUNTER — Other Ambulatory Visit: Payer: Self-pay

## 2015-07-19 MED ORDER — PRAVASTATIN SODIUM 20 MG PO TABS: 20.0000 mg | ORAL_TABLET | Freq: Every day | ORAL | Status: DC

## 2015-07-19 MED ORDER — AMLODIPINE BESYLATE 5 MG PO TABS: 5.0000 mg | ORAL_TABLET | Freq: Every day | ORAL | Status: DC

## 2015-07-19 MED ORDER — CLOPIDOGREL BISULFATE 75 MG PO TABS: 75.0000 mg | ORAL_TABLET | Freq: Every day | ORAL | Status: DC

## 2015-07-19 MED ORDER — LISINOPRIL 40 MG PO TABS: 40.0000 mg | ORAL_TABLET | Freq: Every day | ORAL | Status: DC

## 2015-07-19 MED ORDER — PAROXETINE HCL 30 MG PO TABS: 30.0000 mg | ORAL_TABLET | Freq: Every morning | ORAL | Status: DC

## 2015-08-30 ENCOUNTER — Other Ambulatory Visit: Payer: Self-pay | Admitting: Family

## 2015-09-29 ENCOUNTER — Telehealth: Payer: Self-pay | Admitting: Family

## 2015-09-29 NOTE — Telephone Encounter (Signed)
Called patient to schedule AWV with Ryan Brady. Britt Bottomlvin will call us back to schedule later today.

## 2015-10-15 ENCOUNTER — Other Ambulatory Visit: Payer: Self-pay | Admitting: Family

## 2015-12-05 ENCOUNTER — Other Ambulatory Visit: Payer: Self-pay | Admitting: Family

## 2016-01-14 ENCOUNTER — Other Ambulatory Visit: Payer: Self-pay | Admitting: Family

## 2016-01-30 ENCOUNTER — Ambulatory Visit (INDEPENDENT_AMBULATORY_CARE_PROVIDER_SITE_OTHER): Payer: Commercial Managed Care - HMO | Admitting: Family

## 2016-01-30 ENCOUNTER — Encounter: Payer: Self-pay | Admitting: Family

## 2016-01-30 VITALS — BP 112/72 | HR 79 | Temp 98.7°F | Resp 16 | Ht 69.0 in | Wt 176.0 lb

## 2016-01-30 DIAGNOSIS — I10 Essential (primary) hypertension: Secondary | ICD-10-CM | POA: Diagnosis not present

## 2016-01-30 DIAGNOSIS — F32A Depression, unspecified: Secondary | ICD-10-CM

## 2016-01-30 DIAGNOSIS — F329 Major depressive disorder, single episode, unspecified: Secondary | ICD-10-CM

## 2016-01-30 DIAGNOSIS — E785 Hyperlipidemia, unspecified: Secondary | ICD-10-CM | POA: Insufficient documentation

## 2016-01-30 MED ORDER — LISINOPRIL 40 MG PO TABS: 40.0000 mg | ORAL_TABLET | Freq: Every day | ORAL | Status: DC

## 2016-01-30 MED ORDER — PRAVASTATIN SODIUM 20 MG PO TABS
20.0000 mg | ORAL_TABLET | Freq: Every day | ORAL | Status: DC
Start: 2016-01-30 — End: 2016-08-06

## 2016-01-30 MED ORDER — CLOPIDOGREL BISULFATE 75 MG PO TABS: ORAL_TABLET | ORAL | Status: DC

## 2016-01-30 MED ORDER — PAROXETINE HCL 30 MG PO TABS: ORAL_TABLET | ORAL | Status: DC

## 2016-01-30 MED ORDER — AMLODIPINE BESYLATE 5 MG PO TABS: 5.0000 mg | ORAL_TABLET | Freq: Every day | ORAL | Status: DC

## 2016-01-30 NOTE — Assessment & Plan Note (Signed)
Blood pressure is below goal 140/90 with current regimen with no adverse side effects. Blood pressures at home similar to today's reading. No symptoms of end organ damage. Continue current dosage of lisinopril and amlodipine. Continue to monitor blood pressure at home. Follow-up in 6 months or sooner if needed.

## 2016-01-30 NOTE — Patient Instructions (Signed)
Thank you for choosing [facility].  Summary/Instructions:  Your prescription(s) have been submitted to your pharmacy or been printed and provided for you. Please take as directed and contact our office if you believe you are having problem(s) with the medication(s) or have any questions.  Please stop by the lab on the basement level of the building for your blood work. Your results will be released to MyChart (or called to you) after review, usually within 72 hours after test completion. If any changes need to be made, you will be notified at that same time.  Lab is open Monday-Friday 7:30am-5:30pm No appointment is necessary. Please come fasting.

## 2016-01-30 NOTE — Assessment & Plan Note (Signed)
Hyperlipidemia currently managed with pravastatin. No adverse side effects or myalgias. Obtain lipid profile, CBC, and complete metabolic panel. Continue current dosage of pravastatin pending lipid profile results.

## 2016-01-30 NOTE — Progress Notes (Signed)
Subjective:    Patient ID: Ryan Brady, male    DOB: [birth year], 70 y.o.   MRN: [id number]  Chief Complaint  Patient presents with  . Medication Refill    HPI:  Ryan Brady is a 70 y.o. male who  has a past medical history of Stroke (HCC); Diabetes mellitus without complication (HCC); Hypertension; Arthritis; Depression; Glaucoma; and Allergy. and presents today for a follow up office visit.   1.) Hypertension - Currently maintained on amlodipine and lisinopril. Reports taking the medication as prescribed and denies adverse side effects. Blood pressures at home are below goal of 140/90. Denies symptoms of end organ damage.  BP Readings from Last 3 Encounters:  01/30/16 112/72  04/26/15 152/104  02/24/14 152/104   2.) Depression - Currently maintained on paroxetine. Reports taking the medication as prescribed and denies adverse side effects. Believes that his mood is stable with the current dosage.   3.) Hyperlipidemia - Currently maintained on pravastatin. Reports taking the medication as prescribed and denies adverse side effects or myalgias. Denies chest pain, shortness of breath, or heart palpitations.   No Known Allergies   Current Outpatient Prescriptions on File Prior to Visit  Medication Sig Dispense Refill  . baclofen (LIORESAL) 10 MG tablet Take 1 tablet (10 mg total) by mouth 4 (four) times daily as needed (hiccups). 30 each 0  . Multiple Vitamin (MULTI VITAMIN DAILY PO) Take 1 capsule by mouth every morning.    . Zinc Gluconate (COLD-EEZE) 13.3 MG LOZG Use as directed 1 lozenge in the mouth or throat every 6 (six) hours as needed.     No current facility-administered medications on file prior to visit.    Past Medical History  Diagnosis Date  . Stroke (HCC)     9 strokes  . Diabetes mellitus without complication (HCC)   . Hypertension   . Arthritis   . Depression   . Glaucoma   . Allergy      Review of Systems  Constitutional: Negative for fever and  chills.  Respiratory: Negative for chest tightness and shortness of breath.   Cardiovascular: Negative for chest pain, palpitations and leg swelling.  Neurological: Negative for weakness, numbness and headaches.  Psychiatric/Behavioral: Negative for suicidal ideas, sleep disturbance and decreased concentration. The patient is not nervous/anxious.       Objective:    BP 112/72 mmHg  Pulse 79  Temp(Src) 98.7 F (37.1 C) (Oral)  Resp 16  Ht 5\' 9"  (1.753 m)  Wt 176 lb (79.833 kg)  BMI 25.98 kg/m2  SpO2 97% Nursing note and vital signs reviewed.  Physical Exam  Constitutional: He is oriented to person, place, and time. He appears well-developed and well-nourished. No distress.  Cardiovascular: Normal rate, regular rhythm, normal heart sounds and intact distal pulses.   Pulmonary/Chest: Effort normal and breath sounds normal.  Neurological: He is alert and oriented to person, place, and time.  Skin: Skin is warm and dry.  Psychiatric: He has a normal mood and affect. His behavior is normal. Judgment and thought content normal.       Assessment & Plan:   Problem List Items Addressed This Visit      Cardiovascular and Mediastinum   Accelerated hypertension - Primary    Blood pressure is below goal 140/90 with current regimen with no adverse side effects. Blood pressures at home similar to today's reading. No symptoms of end organ damage. Continue current dosage of lisinopril and amlodipine. Continue to monitor blood pressure at home.  Follow-up in 6 months or sooner if needed.      Relevant Medications   amLODipine (NORVASC) 5 MG tablet   lisinopril (PRINIVIL,ZESTRIL) 40 MG tablet   pravastatin (PRAVACHOL) 20 MG tablet   Other Relevant Orders   Comprehensive metabolic panel   CBC   Lipid panel     Other   Depression    Mood appears stable with no adverse side effects with current regimen. No suicidal ideations. Continue current dosage of paroxetine. Obtain complete metabolic  panel to check liver function. Follow-up in 6 months or sooner if needed.      Relevant Medications   PARoxetine (PAXIL) 30 MG tablet   Hyperlipidemia    Hyperlipidemia currently managed with pravastatin. No adverse side effects or myalgias. Obtain lipid profile, CBC, and complete metabolic panel. Continue current dosage of pravastatin pending lipid profile results.      Relevant Medications   amLODipine (NORVASC) 5 MG tablet   lisinopril (PRINIVIL,ZESTRIL) 40 MG tablet   pravastatin (PRAVACHOL) 20 MG tablet   Other Relevant Orders   Comprehensive metabolic panel   CBC   Lipid panel       I have discontinued Mr. Meadowcroft amLODipine. I have also changed his amLODipine and pravastatin. Additionally, I am having him maintain his baclofen, Zinc Gluconate, Multiple Vitamin (MULTI VITAMIN DAILY PO), lisinopril, PARoxetine, and clopidogrel.   Meds ordered this encounter  Medications  . amLODipine (NORVASC) 5 MG tablet    Sig: Take 1 tablet (5 mg total) by mouth daily.    Dispense:  90 tablet    Refill:  1  . lisinopril (PRINIVIL,ZESTRIL) 40 MG tablet    Sig: Take 1 tablet (40 mg total) by mouth daily.    Dispense:  90 tablet    Refill:  1  . PARoxetine (PAXIL) 30 MG tablet    Sig: TAKE ONE TABLET BY MOUTH ONCE DAILY IN THE MORNING    Dispense:  90 tablet    Refill:  1  . pravastatin (PRAVACHOL) 20 MG tablet    Sig: Take 1 tablet (20 mg total) by mouth daily.    Dispense:  90 tablet    Refill:  1  . clopidogrel (PLAVIX) 75 MG tablet    Sig: TAKE ONE TABLET BY MOUTH ONCE DAILY WITH BREAKFAST    Dispense:  30 tablet    Refill:  0     Follow-up: Return in about 6 months (around 07/31/2016).  Jeanine Luz, FNP

## 2016-01-30 NOTE — Progress Notes (Signed)
Pre visit review using our clinic review tool, if applicable. No additional management support is needed unless otherwise documented below in the visit note. 

## 2016-01-30 NOTE — Assessment & Plan Note (Signed)
Mood appears stable with no adverse side effects with current regimen. No suicidal ideations. Continue current dosage of paroxetine. Obtain complete metabolic panel to check liver function. Follow-up in 6 months or sooner if needed.

## 2016-01-31 ENCOUNTER — Ambulatory Visit: Payer: Commercial Managed Care - HMO | Admitting: Family

## 2016-01-31 ENCOUNTER — Telehealth: Payer: Self-pay | Admitting: Family

## 2016-01-31 ENCOUNTER — Other Ambulatory Visit (INDEPENDENT_AMBULATORY_CARE_PROVIDER_SITE_OTHER): Payer: Commercial Managed Care - HMO

## 2016-01-31 DIAGNOSIS — I10 Essential (primary) hypertension: Secondary | ICD-10-CM

## 2016-01-31 DIAGNOSIS — E785 Hyperlipidemia, unspecified: Secondary | ICD-10-CM

## 2016-01-31 LAB — COMPREHENSIVE METABOLIC PANEL
ALK PHOS: 108 U/L (ref 39–117)
ALT: 12 U/L (ref 0–53)
AST: 13 U/L (ref 0–37)
Albumin: 4 g/dL (ref 3.5–5.2)
BUN: 16 mg/dL (ref 6–23)
CO2: 29 meq/L (ref 19–32)
Calcium: 9.3 mg/dL (ref 8.4–10.5)
Chloride: 106 mEq/L (ref 96–112)
Creatinine, Ser: 1.31 mg/dL (ref 0.40–1.50)
GFR: 69.2 mL/min (ref >60.00)
GLUCOSE: 93 mg/dL (ref 70–99)
POTASSIUM: 3.9 meq/L (ref 3.5–5.1)
SODIUM: 141 meq/L (ref 135–145)
TOTAL PROTEIN: 7.2 g/dL (ref 6.0–8.3)
Total Bilirubin: 0.4 mg/dL (ref 0.2–1.2)

## 2016-01-31 LAB — LIPID PANEL
CHOL/HDL RATIO: 4
Cholesterol: 159 mg/dL (ref 0–200)
HDL: 38.5 mg/dL — AB (ref >39.00)
LDL Cholesterol: 109 mg/dL — ABNORMAL HIGH (ref 0–99)
NONHDL: 120.32
Triglycerides: 56 mg/dL (ref 0.0–149.0)
VLDL: 11.2 mg/dL (ref 0.0–40.0)

## 2016-01-31 LAB — CBC
HEMATOCRIT: 39.8 % (ref 39.0–52.0)
HEMOGLOBIN: 13.1 g/dL (ref 13.0–17.0)
MCHC: 33 g/dL (ref 30.0–36.0)
MCV: 91.4 fl (ref 78.0–100.0)
PLATELETS: 315 10*3/uL (ref 150.0–400.0)
RBC: 4.36 Mil/uL (ref 4.22–5.81)
RDW: 21.5 % — AB (ref 11.5–15.5)
WBC: 11.5 10*3/uL — ABNORMAL HIGH (ref 4.0–10.5)

## 2016-01-31 NOTE — Telephone Encounter (Signed)
Please inform patient that his cholesterol numbers are improved and please continue to take the medications as prescribed. I believe the rest of the way can be managed through nutrition and physical activity. Otherwise kidney function, liver function and electrolytes are within the normal ranges. Follow up in 6 months or sooner if needed.

## 2016-02-01 NOTE — Telephone Encounter (Signed)
LVM with results

## 2016-03-05 ENCOUNTER — Other Ambulatory Visit: Payer: Self-pay | Admitting: *Deleted

## 2016-03-05 DIAGNOSIS — I10 Essential (primary) hypertension: Secondary | ICD-10-CM

## 2016-03-05 MED ORDER — LISINOPRIL 40 MG PO TABS: 40.0000 mg | ORAL_TABLET | Freq: Every day | ORAL | Status: DC

## 2016-03-05 MED ORDER — AMLODIPINE BESYLATE 5 MG PO TABS: 5.0000 mg | ORAL_TABLET | Freq: Every day | ORAL | Status: DC

## 2016-03-05 NOTE — Telephone Encounter (Signed)
Received call pt son states father is needing refill on his BP medications. Verified pharmacy inform will send refill to [facility]...Raechel Chute/lmb

## 2016-04-16 ENCOUNTER — Other Ambulatory Visit: Payer: Self-pay | Admitting: Family

## 2016-07-27 ENCOUNTER — Telehealth: Payer: Self-pay | Admitting: Emergency Medicine

## 2016-07-30 NOTE — Telephone Encounter (Signed)
error 

## 2016-07-31 ENCOUNTER — Ambulatory Visit: Payer: Commercial Managed Care - HMO | Admitting: Family

## 2016-08-06 ENCOUNTER — Other Ambulatory Visit: Payer: Self-pay

## 2016-08-06 MED ORDER — PRAVASTATIN SODIUM 20 MG PO TABS: 20.0000 mg | ORAL_TABLET | Freq: Every day | ORAL | 0 refills | Status: DC

## 2016-10-25 ENCOUNTER — Other Ambulatory Visit: Payer: Self-pay | Admitting: Family

## 2016-10-25 DIAGNOSIS — F329 Major depressive disorder, single episode, unspecified: Secondary | ICD-10-CM

## 2016-10-25 DIAGNOSIS — F32A Depression, unspecified: Secondary | ICD-10-CM

## 2016-11-05 ENCOUNTER — Ambulatory Visit (INDEPENDENT_AMBULATORY_CARE_PROVIDER_SITE_OTHER): Payer: Medicare HMO | Admitting: Family

## 2016-11-05 ENCOUNTER — Encounter: Payer: Self-pay | Admitting: Family

## 2016-11-05 VITALS — BP 152/84 | HR 79 | Temp 98.7°F | Resp 16 | Ht 69.0 in | Wt 177.8 lb

## 2016-11-05 DIAGNOSIS — I10 Essential (primary) hypertension: Secondary | ICD-10-CM | POA: Diagnosis not present

## 2016-11-05 DIAGNOSIS — M79671 Pain in right foot: Secondary | ICD-10-CM

## 2016-11-05 DIAGNOSIS — Z23 Encounter for immunization: Secondary | ICD-10-CM

## 2016-11-05 DIAGNOSIS — E782 Mixed hyperlipidemia: Secondary | ICD-10-CM | POA: Diagnosis not present

## 2016-11-05 DIAGNOSIS — M79672 Pain in left foot: Secondary | ICD-10-CM

## 2016-11-05 MED ORDER — TEMAZEPAM 15 MG PO CAPS: 15.0000 mg | ORAL_CAPSULE | Freq: Every evening | ORAL | 0 refills | Status: DC | PRN

## 2016-11-05 NOTE — Assessment & Plan Note (Signed)
Hyperlipidemia currently maintained on pravastatin with no adverse side effects or myalgias. Obtain lipid profile at next fasting opportunity. Continue current dosage of pravastatin pending blood work results.

## 2016-11-05 NOTE — Patient Instructions (Addendum)
Thank you for choosing [facility].  SUMMARY AND INSTRUCTIONS:  Ice x 20 minutes every 2 hours as needed and after activity and before bed.   Stretches and exercises daily.  Continue to monitor blood pressure at home.   Continue to follow a low sodium diet.  Tylenol as needed for discomfort.   Medication:  Continue to take your medications as prescribed.   Labs:  Please stop by the lab on the lower level of the building for your blood work. Your results will be released to MyChart (or called to you) after review, usually within 72 hours after test completion. If any changes need to be made, you will be notified at that same time.  1.) The lab is open from 7:30am to 5:30 pm Monday-Friday 2.) No appointment is necessary 3.) Fasting (if needed) is 6-8 hours after food and drink; black coffee and water are okay   Follow up:  If your symptoms worsen or fail to improve, please contact our office for further instruction, or in case of emergency go directly to the emergency room at the closest medical facility.    [facility] Ask your health care provider which exercises are safe for you. Do exercises exactly as told by your health care provider and adjust them as directed. It is normal to feel mild stretching, pulling, tightness, or discomfort as you do these exercises, but you should stop right away if you feel sudden pain or your pain gets worse. Do not begin these exercises until told by your health care provider. Stretching and range of motion exercises These exercises warm up your muscles and joints and improve the movement and flexibility of your ankle. These exercises also help to relieve pain, numbness, and tingling. Exercise A: Standing wall calf stretch, knee straight 1. Stand with your hands against a wall. 2. Extend your __________ leg behind you and bend your front knee slightly. Keep both of your heels on the floor. 3. Point the toes of your back foot  slightly inward. 4. Keeping your heels on the floor and your back knee straight, shift your weight toward the wall. Do not allow your back to arch. You should feel a gentle stretch in your calf. 5. Hold this position for seconds. Repeat __________ times. Complete this stretch __________ times per day. Exercise B: Standing wall calf stretch, knee bent 1. Stand with your hands against a wall. 2. Extend your __________ leg behind you, and bend your front knee slightly. Keep both of your heels on the floor. 3. Point the toes of your back foot slightly inward. 4. Keeping your heels on the floor, unlock your back knee so that it is bent. You should feel a gentle stretch deep in your calf. 5. Hold this position for __________ seconds. Repeat __________ times. Complete this stretch __________ times per day. Strengthening exercises These exercises build strength and control of your ankle. Endurance is the ability to use your muscles for a long time, even after they get tired. Exercise C: Plantar flexion with band 1. Sit on the floor with your __________ leg extended. You may put a pillow under your calf to give your foot more room to move. 2. Loop a rubber exercise band or tube around the ball of your __________ foot. The ball of your foot is on the walking surface, right under your toes. The band or tube should be slightly tense when your foot is relaxed. If the band or tube slips, you can put on  your shoe or put a washcloth between the band and your foot to help it stay in place. 3. Slowly point your toes downward, pushing them away from you. 4. Hold this position for __________ seconds. 5. Slowly release the tension in the band or tube, controlling smoothly until your foot is back to the starting position. Repeat __________ times. Complete this exercise __________ times per day. Exercise D: Heel raise with eccentric lower 1. Stand on a step with the balls of your feet. The ball of your foot is on the  walking surface, right under your toes.  Do not put your heels on the step.  For balance, rest your hands on the wall or on a railing. 2. Rise up onto the balls of your feet. 3. Keeping your heels up, shift all of your weight to your __________ leg and pick up your other leg. 4. Slowly lower your __________ leg so your heel drops below the level of the step. 5. Put down your foot. If told by your health care provider, build up to:  3 sets of 15 repetitions while keeping your knees straight.  3 sets of 15 repetitions while keeping your knees bent as far as told by your health care provider. Complete this exercise __________ times per day. If this exercise is too easy, try doing it while wearing a backpack with weights in it. Balance exercises These exercises improve or maintain your balance. Balance is important in preventing falls. Exercise E: Single leg stand 1. Without shoes, stand near a railing or in a door frame. Hold on to the railing or door frame as needed. 2. Stand on your __________ foot. Keep your big toe down on the floor and try to keep your arch lifted. 3. Hold this position for __________ seconds. Repeat __________ times. Complete this exercise __________ times per day. If this exercise is too easy, you can try it with your eyes closed or while standing on a pillow. This information is not intended to replace advice given to you by your health care provider. Make sure you discuss any questions you have with your health care provider. Document Released: 05/09/2005 Document Revised: 06/14/2016 Document Reviewed: 06/14/2015 Elsevier Interactive Patient Education  2017 [employer].

## 2016-11-05 NOTE — Progress Notes (Signed)
Subjective:    Patient ID: Ryan Brady, male    DOB: November 08, 1945, 71 y.o.   MRN: [id number]  Chief Complaint  Patient presents with  . Follow-up    having feet pain, referral to podiatry, still having trouble with left eye since stroke    HPI:  Ryan Brady is a [age] y.o. male who  has a past medical history of Allergy; Arthritis; Depression; Diabetes mellitus without complication (HCC); Glaucoma; Hypertension; and Stroke [facility](HCC). and presents today for a follow up office visit.   1.) Hypertension - Currently maitnained on lisinopril and amlodipine. Denies adverse side effects or headaches. Continues to have waxing and waning of vision of left eye secondary to previous stroke otherwise no new symptoms of end organ damage noted. Working on file low sodium diet.  BP Readings from Last 3 Encounters:  11/05/16 (!) 152/84  01/30/16 112/72  04/26/15 (!) 152/104    2.) Heel pain - This is a new problem. Associated symptoms of pain located in his bilateral heels has been going on for about 2-3 weeks. Denies any modifying factors or attempted treatments. Denies any trauma or injury. Exercise is currently limited. Pain is described as achy and occurs primarily when walking.   3.) Hyperlipidemia - Currently maintained on pravastatin. Reports taking the medication as prescribed and denies adverse side effects or myalgias. Working on decreasing saturated fats and processed/sugary foods.   Lab Results  Component Value Date   CHOL 159 01/31/2016   HDL 38.50 (L) 01/31/2016   LDLCALC 109 (H) 01/31/2016   TRIG 56.0 01/31/2016   CHOLHDL 4 01/31/2016    No Known Allergies    Outpatient Medications Prior to Visit  Medication Sig Dispense Refill  . amLODipine (NORVASC) 5 MG tablet Take 1 tablet (5 mg total) by mouth daily. 90 tablet 1  . baclofen (LIORESAL) 10 MG tablet Take 1 tablet (10 mg total) by mouth 4 (four) times daily as needed (hiccups). 30 each 0  . clopidogrel (PLAVIX) 75 MG tablet  TAKE ONE TABLET BY MOUTH ONCE DAILY WITH BREAKFAST 30 tablet 5  . lisinopril (PRINIVIL,ZESTRIL) 40 MG tablet Take 1 tablet (40 mg total) by mouth daily. 90 tablet 1  . Multiple Vitamin (MULTI VITAMIN DAILY PO) Take 1 capsule by mouth every morning.    Marland Kitchen. PARoxetine (PAXIL) 30 MG tablet TAKE ONE TABLET BY MOUTH ONCE DAILY IN THE MORNING 90 tablet 1  . pravastatin (PRAVACHOL) 20 MG tablet Take 1 tablet (20 mg total) by mouth daily. 90 tablet 0  . Zinc Gluconate (COLD-EEZE) 13.3 MG LOZG Use as directed 1 lozenge in the mouth or throat every 6 (six) hours as needed.     No facility-administered medications prior to visit.       Past Surgical History:  Procedure Laterality Date  . TONSILLECTOMY        Past Medical History:  Diagnosis Date  . Allergy   . Arthritis   . Depression   . Diabetes mellitus without complication (HCC)   . Glaucoma   . Hypertension   . Stroke (HCC)    9 strokes      Review of Systems  Constitutional: Negative for chills and fever.  Eyes:       Positive for changes in vision of left eye.  Respiratory: Negative for cough, chest tightness and wheezing.   Cardiovascular: Negative for chest pain, palpitations and leg swelling.  Neurological: Negative for dizziness, weakness and light-headedness.      Objective:  BP (!) 152/84 (BP Location: Left Arm, Patient Position: Sitting, Cuff Size: Normal)   Pulse 79   Temp 98.7 F (37.1 C) (Oral)   Resp 16   Ht 5\' 9"  (1.753 m)   Wt 177 lb 12.8 oz (80.6 kg)   SpO2 97%   BMI 26.26 kg/m  Nursing note and vital signs reviewed.  Physical Exam  Constitutional: He is oriented to person, place, and time. He appears well-developed and well-nourished. No distress.  Eyes: Left eye exhibits no chemosis, no discharge, no exudate and no hordeolum. No foreign body present in the left eye. Left conjunctiva is not injected. Left conjunctiva has no hemorrhage.  Decreased vision noted in left eye.   Cardiovascular: Normal  rate, regular rhythm, normal heart sounds and intact distal pulses.   Pulmonary/Chest: Effort normal and breath sounds normal. No respiratory distress. He has no wheezes. He has no rales. He exhibits no tenderness.  Musculoskeletal:  Bilateral heels - No obvious deformity, discoloration or edema. No tenderness, crepitus or deformity. Range of motion is limited in dorsiflexion. Pulses are intact and appropriate.   Neurological: He is alert and oriented to person, place, and time.  Skin: Skin is warm and dry.  Psychiatric: He has a normal mood and affect. His behavior is normal. Judgment and thought content normal.        Assessment & Plan:   Problem List Items Addressed This Visit      Cardiovascular and Mediastinum   Accelerated hypertension    Hypertension appears labile and slightly elevated today above goal of 140/90 with compliance with current regimen. Encouraged to monitor blood pressures at home and follow low-sodium diet. Continue current dosage of lisinopril and amlodipine. Denies worst headache of life with no new symptoms of end organ damage noted on physical exam. Does have follow-up with ophthalmology for vision check. Continue to monitor and schedule nurse visit for blood pressure check in 2 weeks.        Other   Hyperlipidemia - Primary    Hyperlipidemia currently maintained on pravastatin with no adverse side effects or myalgias. Obtain lipid profile at next fasting opportunity. Continue current dosage of pravastatin pending blood work results.      Relevant Orders   Lipid Profile   Heel pain, bilateral    Bilateral heel pain with concern for tendinitis secondary to decreased movements. Treat conservatively with ice, elevation, Tylenol as needed for discomfort and home exercise therapy. Follow up if symptoms worsen or do not improve.        Other Visit Diagnoses    Encounter for immunization       Relevant Orders   Flu vaccine HIGH DOSE PF (Completed)       I  have discontinued Mr. Szydlowski's temazepam. I am also having him maintain his baclofen, Zinc Gluconate, Multiple Vitamin (MULTI VITAMIN DAILY PO), amLODipine, lisinopril, clopidogrel, pravastatin, and PARoxetine.   Meds ordered this encounter  Medications  . DISCONTD: temazepam (RESTORIL) 15 MG capsule    Sig: Take 1-2 capsules (15-30 mg total) by mouth at bedtime as needed for sleep.    Dispense:  14 capsule    Refill:  0    Fill on or after 11/25/16    Order Specific Question:   Supervising Provider    Answer:   Hillard Danker A [[id number]]     Follow-up: Return in about 2 weeks (around 11/19/2016), or if symptoms worsen or fail to improve, for for blood pressure check - nurse visit.Marland Kitchen  Mauricio Po, FNP

## 2016-11-05 NOTE — Assessment & Plan Note (Signed)
Hypertension appears labile and slightly elevated today above goal of 140/90 with compliance with current regimen. Encouraged to monitor blood pressures at home and follow low-sodium diet. Continue current dosage of lisinopril and amlodipine. Denies worst headache of life with no new symptoms of end organ damage noted on physical exam. Does have follow-up with ophthalmology for vision check. Continue to monitor and schedule nurse visit for blood pressure check in 2 weeks.

## 2016-11-05 NOTE — Assessment & Plan Note (Signed)
Bilateral heel pain with concern for tendinitis secondary to decreased movements. Treat conservatively with ice, elevation, Tylenol as needed for discomfort and home exercise therapy. Follow up if symptoms worsen or do not improve.

## 2016-11-16 ENCOUNTER — Telehealth: Payer: Self-pay

## 2016-11-16 ENCOUNTER — Ambulatory Visit: Payer: Medicare HMO

## 2016-11-16 VITALS — BP 142/90

## 2016-11-16 DIAGNOSIS — I10 Essential (primary) hypertension: Secondary | ICD-10-CM

## 2016-11-16 NOTE — Telephone Encounter (Signed)
Patient came in today, nurse visit--bp reading 142/90--routing to greg, please advise if you want to make any changes to bp meds, I will call patient back, thanks

## 2016-11-16 NOTE — Progress Notes (Signed)
Patient came in today on nurse visit, bp reading 142/90---routing to greg, please advise if you want to make any changes to bp meds, I will call patient back--thanks

## 2016-11-19 MED ORDER — LISINOPRIL 40 MG PO TABS: 40.0000 mg | ORAL_TABLET | Freq: Every day | ORAL | 0 refills | Status: DC

## 2016-11-19 MED ORDER — AMLODIPINE BESYLATE 10 MG PO TABS: 10.0000 mg | ORAL_TABLET | Freq: Every day | ORAL | 0 refills | Status: DC

## 2016-11-19 NOTE — Telephone Encounter (Signed)
Advised patient of Ryan Brady note/instructions, patient will start rx and call back to schedule office visit closer to one month out, not sure of schedule right now

## 2016-11-19 NOTE — Addendum Note (Signed)
Addended by: Jeanine LuzALONE, Signa Cheek D on: 11/19/2016 01:43 PM   Modules accepted: Orders

## 2016-11-19 NOTE — Telephone Encounter (Signed)
I am sending in a prescription for amlodipine at 10 mg. Please follow up office visit in 1 month.

## 2016-11-30 ENCOUNTER — Ambulatory Visit: Payer: Medicare HMO | Admitting: Nurse Practitioner

## 2016-12-25 ENCOUNTER — Telehealth: Payer: Self-pay | Admitting: Family

## 2016-12-25 DIAGNOSIS — E785 Hyperlipidemia, unspecified: Secondary | ICD-10-CM

## 2016-12-25 DIAGNOSIS — I10 Essential (primary) hypertension: Secondary | ICD-10-CM

## 2017-01-03 MED ORDER — LISINOPRIL 40 MG PO TABS: 40.0000 mg | ORAL_TABLET | Freq: Every day | ORAL | 2 refills | Status: DC

## 2017-01-03 MED ORDER — PRAVASTATIN SODIUM 20 MG PO TABS: 20.0000 mg | ORAL_TABLET | Freq: Every day | ORAL | 2 refills | Status: DC

## 2017-01-03 MED ORDER — PAROXETINE HCL 30 MG PO TABS: ORAL_TABLET | ORAL | 1 refills | Status: DC

## 2017-01-03 MED ORDER — AMLODIPINE BESYLATE 10 MG PO TABS: 10.0000 mg | ORAL_TABLET | Freq: Every day | ORAL | 2 refills | Status: DC

## 2017-01-03 NOTE — Telephone Encounter (Signed)
Medication sent with refills

## 2017-01-03 NOTE — Addendum Note (Signed)
Addended by: Mercer PodWRENN, Chalmers Iddings E on: 01/03/2017 03:00 PM   Modules accepted: Orders

## 2017-01-03 NOTE — Telephone Encounter (Signed)
The pts son called checking on a prescription that was requested through [facility] last week. Please advise. Thanks [employer]

## 2017-04-06 ENCOUNTER — Other Ambulatory Visit: Payer: Self-pay | Admitting: Family

## 2017-04-06 DIAGNOSIS — I10 Essential (primary) hypertension: Secondary | ICD-10-CM

## 2017-07-03 ENCOUNTER — Other Ambulatory Visit: Payer: Self-pay | Admitting: Family

## 2017-10-14 ENCOUNTER — Other Ambulatory Visit: Payer: Self-pay | Admitting: Family

## 2017-10-14 DIAGNOSIS — I10 Essential (primary) hypertension: Secondary | ICD-10-CM

## 2018-01-13 ENCOUNTER — Other Ambulatory Visit: Payer: Self-pay | Admitting: Family

## 2018-01-13 DIAGNOSIS — I10 Essential (primary) hypertension: Secondary | ICD-10-CM

## 2018-01-13 DIAGNOSIS — F329 Major depressive disorder, single episode, unspecified: Secondary | ICD-10-CM

## 2018-01-13 DIAGNOSIS — E785 Hyperlipidemia, unspecified: Secondary | ICD-10-CM

## 2018-01-13 DIAGNOSIS — F32A Depression, unspecified: Secondary | ICD-10-CM

## 2018-01-23 ENCOUNTER — Ambulatory Visit: Payer: Medicare HMO | Admitting: Family Medicine

## 2018-01-23 ENCOUNTER — Encounter: Payer: Self-pay | Admitting: Family Medicine

## 2018-01-23 VITALS — BP 134/80 | HR 101 | Ht 69.0 in | Wt 166.4 lb

## 2018-01-23 DIAGNOSIS — E785 Hyperlipidemia, unspecified: Secondary | ICD-10-CM

## 2018-01-23 DIAGNOSIS — I6300 Cerebral infarction due to thrombosis of unspecified precerebral artery: Secondary | ICD-10-CM

## 2018-01-23 DIAGNOSIS — Z8673 Personal history of transient ischemic attack (TIA), and cerebral infarction without residual deficits: Secondary | ICD-10-CM | POA: Diagnosis not present

## 2018-01-23 DIAGNOSIS — I70209 Unspecified atherosclerosis of native arteries of extremities, unspecified extremity: Secondary | ICD-10-CM | POA: Diagnosis not present

## 2018-01-23 DIAGNOSIS — F0150 Vascular dementia without behavioral disturbance: Secondary | ICD-10-CM | POA: Diagnosis not present

## 2018-01-23 DIAGNOSIS — I7090 Unspecified atherosclerosis: Principal | ICD-10-CM

## 2018-01-23 DIAGNOSIS — F0280 Dementia in other diseases classified elsewhere without behavioral disturbance: Secondary | ICD-10-CM | POA: Diagnosis not present

## 2018-01-23 DIAGNOSIS — Z720 Tobacco use: Secondary | ICD-10-CM

## 2018-01-23 DIAGNOSIS — F329 Major depressive disorder, single episode, unspecified: Secondary | ICD-10-CM | POA: Diagnosis not present

## 2018-01-23 DIAGNOSIS — E782 Mixed hyperlipidemia: Secondary | ICD-10-CM | POA: Diagnosis not present

## 2018-01-23 DIAGNOSIS — F32A Depression, unspecified: Secondary | ICD-10-CM

## 2018-01-23 MED ORDER — PAROXETINE HCL 20 MG PO TABS: 20.0000 mg | ORAL_TABLET | Freq: Every day | ORAL | 1 refills | Status: AC

## 2018-01-23 MED ORDER — PRAVASTATIN SODIUM 20 MG PO TABS: 20.0000 mg | ORAL_TABLET | Freq: Every day | ORAL | 1 refills | Status: DC

## 2018-01-23 NOTE — Progress Notes (Signed)
Subjective:  Patient ID: Ryan Brady, male    DOB: 04/21/1946  Age: 72 y.o. MRN: [id number]  CC: Establish Care   HPI Ryan Brady presents for establishment of care by his son.  He is currently living on his own in his own apartment.  He is able to dress himself and toilet for himself but needs assistance with meals and build pain.  He is smoking a half a pack of cigarettes daily.  He has a history of 6 known strokes.  He takes Paxil for a long-standing history of depression.  He does not drink alcohol or use illicit drugs.  He is retired from working in [employer].  His son is his only remaining family.  His daughter-in-law sets out his weekly medicines for him in a pill container.  He exercises by walking around the apartment complex where he lives.  Outpatient Medications Prior to Visit  Medication Sig Dispense Refill  . amLODipine (NORVASC) 5 MG tablet TAKE ONE TABLET BY MOUTH ONCE DAILY 90 tablet 1  . clopidogrel (PLAVIX) 75 MG tablet TAKE ONE TABLET BY MOUTH ONCE DAILY WITH  BREAKFAST 30 tablet 1  . lisinopril (PRINIVIL,ZESTRIL) 40 MG tablet Take 1 tablet (40 mg total) by mouth daily. 90 tablet 2  . Multiple Vitamin (MULTI VITAMIN DAILY PO) Take 1 capsule by mouth every morning.    Marland Kitchen PARoxetine (PAXIL) 30 MG tablet TAKE ONE TABLET BY MOUTH ONCE DAILY IN THE MORNING 90 tablet 1  . pravastatin (PRAVACHOL) 20 MG tablet Take 1 tablet (20 mg total) by mouth daily. Yearly physical w/labs due in April must see MD for refills 90 tablet 2  . amLODipine (NORVASC) 10 MG tablet Take 1 tablet (10 mg total) by mouth daily. 90 tablet 2  . baclofen (LIORESAL) 10 MG tablet Take 1 tablet (10 mg total) by mouth 4 (four) times daily as needed (hiccups). 30 each 0  . pravastatin (PRAVACHOL) 20 MG tablet Take 1 tablet (20 mg total) by mouth daily. 90 tablet 0  . Zinc Gluconate (COLD-EEZE) 13.3 MG LOZG Use as directed 1 lozenge in the mouth or throat every 6 (six) hours as needed.     No facility-administered  medications prior to visit.     ROS Review of Systems  Constitutional: Negative for activity change, chills, fatigue, fever and unexpected weight change.  HENT: Negative.   Eyes: Negative.   Respiratory: Negative.   Cardiovascular: Negative.   Gastrointestinal: Negative.   Endocrine: Negative for polyphagia and polyuria.  Genitourinary: Negative for difficulty urinating, frequency and urgency.  Skin: Negative.   Allergic/Immunologic: Negative for immunocompromised state.  Neurological: Negative for seizures and headaches.  Psychiatric/Behavioral: Positive for confusion. Negative for agitation and behavioral problems. The patient is not hyperactive.     Objective:  BP 134/80 (BP Location: Left Arm, Patient Position: Sitting, Cuff Size: Normal)   Pulse (!) 101   Ht 5\' 9"  (1.753 m)   Wt 166 lb 6 oz (75.5 kg)   SpO2 96%   BMI 24.57 kg/m   BP Readings from Last 3 Encounters:  01/23/18 134/80  11/16/16 (!) 142/90  11/05/16 (!) 152/84    Wt Readings from Last 3 Encounters:  01/23/18 166 lb 6 oz (75.5 kg)  11/05/16 177 lb 12.8 oz (80.6 kg)  01/30/16 176 lb (79.8 kg)    Physical Exam  Constitutional: He appears well-developed and well-nourished. No distress.  HENT:  Head: Normocephalic and atraumatic.  Right Ear: External ear normal.  Left Ear:  External ear normal.  Mouth/Throat: Oropharynx is clear and moist.  Eyes: Pupils are equal, round, and reactive to light. Conjunctivae are normal. Right eye exhibits no discharge. Left eye exhibits no discharge. No scleral icterus.  Neck: No JVD present. No tracheal deviation present.  Cardiovascular: Normal rate and regular rhythm.  Murmur heard. Pulmonary/Chest: Effort normal. No stridor. He has decreased breath sounds.  Abdominal: Bowel sounds are normal.  Neurological: He is alert.  Skin: Skin is warm and dry. He is not diaphoretic.  Psychiatric: His behavior is normal. His affect is blunt. His speech is delayed. He exhibits  abnormal recent memory.  Patient was oriented to person, time and place.  Didn't know who the president is.  0/3 immediate recall.   Unable to draw requested time.      01/23/18 1116  Last Filed Value        Three Word Registration  Was the patient able to repeat memory words in 3 tries? no no  Which version was used? Version1 : banana, sunrise, chair Version1 : banana, sunrise, chair  Clock Drawing  Clock numbers correct? yes yes  Clock time correct (11:10)? no no  Normal clock drawing test? no no  Three Word Recall   How many words correct? 0 0  Which version was used? Version 1: banana, sunrise, chair Version 1: banana, sunrise, chair  Mini-Cog Scoring  Mini-Cog Scoring 0 (calculated) 0 (calculated)     Lab Results  Component Value Date   WBC 11.5 (H) 01/31/2016   HGB 13.1 01/31/2016   HCT 39.8 01/31/2016   PLT 315.0 01/31/2016   GLUCOSE 93 01/31/2016   CHOL 159 01/31/2016   TRIG 56.0 01/31/2016   HDL 38.50 (L) 01/31/2016   LDLCALC 109 (H) 01/31/2016   ALT 12 01/31/2016   AST 13 01/31/2016   NA 141 01/31/2016   K 3.9 01/31/2016   CL 106 01/31/2016   CREATININE 1.31 01/31/2016   BUN 16 01/31/2016   CO2 29 01/31/2016   TSH 1.78 01/15/2014   PSA 5.18 (H) 01/15/2014   INR 1.08 04/24/2013   HGBA1C 5.6 04/25/2013    Ct Head Wo Contrast  Result Date: 08/22/2013 CLINICAL DATA:  Altered mental status. Recent stroke. EXAM: CT HEAD WITHOUT CONTRAST TECHNIQUE: Contiguous axial images were obtained from the base of the skull through the vertex without intravenous contrast. COMPARISON:  04/25/2013 and 04/24/2013 FINDINGS: There is no evidence of intracranial hemorrhage, brain edema, or other signs of acute infarction. There is no evidence of intracranial mass lesion or mass effect. No abnormal extraaxial fluid collections are identified. A subacute to chronic lacunar infarct is seen in the right basal ganglia, as noted on recent MRI. Other old bilateral basal ganglia  lacunes and chronic small vessel disease are also stable in appearance. Mild cerebral atrophy and ventriculomegaly are also unchanged. IMPRESSION: No acute intracranial findings. Subacute to chronic right basal ganglia lacunar infarct, as demonstrated on recent MRI. Other old basal ganglia lacunae, chronic small vessel disease, and cerebral atrophy are also stable in appearance. Electronically Signed   By: Myles RosenthalJohn  Stahl M.D.   On: 08/22/2013 13:34    Assessment & Plan:   Ryan Brady was seen today for establish care.  Diagnoses and all orders for this visit:  Multi-infarct dementia due to atherosclerosis (HCC)  Dementia due to medical condition without behavioral disturbance  Depression, unspecified depression type -     PARoxetine (PAXIL) 20 MG tablet; Take 1 tablet (20 mg total) by mouth daily.  Cerebral infarction due to thrombosis of precerebral artery (HCC)  Mixed hyperlipidemia  Hyperlipidemia, unspecified hyperlipidemia type -     pravastatin (PRAVACHOL) 20 MG tablet; Take 1 tablet (20 mg total) by mouth daily. Yearly physical w/labs due in April must see MD for refills  Tobacco abuse   I have discontinued Cowan Gust's baclofen, Zinc Gluconate, and PARoxetine. I am also having him start on PARoxetine. Additionally, I am having him maintain his Multiple Vitamin (MULTI VITAMIN DAILY PO), lisinopril, amLODipine, clopidogrel, and pravastatin.  Meds ordered this encounter  Medications  . PARoxetine (PAXIL) 20 MG tablet    Sig: Take 1 tablet (20 mg total) by mouth daily.    Dispense:  30 tablet    Refill:  1  . pravastatin (PRAVACHOL) 20 MG tablet    Sig: Take 1 tablet (20 mg total) by mouth daily. Yearly physical w/labs due in April must see MD for refills    Dispense:  30 tablet    Refill:  1   Strongly encouraged the patient to stop smoking.  I stressed that his independent living status may be in jeopardy if he does not stop smoking.  I question whether or not he is over  medicated with Paxil.  We will try him on 20 mg a day for a month and follow him up.  Family is aware of my concerns.  Follow-up: Return in about 1 month (around 02/22/2018).  Mliss Sax, MD

## 2018-01-23 NOTE — Patient Instructions (Signed)
Coping with Quitting Smoking Quitting smoking is a physical and mental challenge. You will face cravings, withdrawal symptoms, and temptation. Before quitting, work with your health care provider to make a plan that can help you cope. Preparation can help you quit and keep you from giving in. How can I cope with cravings? Cravings usually last for 5-10 minutes. If you get through it, the craving will pass. Consider taking the following actions to help you cope with cravings:  Keep your mouth busy: ? Chew sugar-free gum. ? Suck on hard candies or a straw. ? Brush your teeth.  Keep your hands and body busy: ? Immediately change to a different activity when you feel a craving. ? Squeeze or play with a ball. ? Do an activity or a hobby, like making bead jewelry, practicing needlepoint, or working with wood. ? Mix up your normal routine. ? Take a short exercise break. Go for a quick walk or run up and down stairs. ? Spend time in public places where smoking is not allowed.  Focus on doing something kind or helpful for someone else.  Call a friend or family member to talk during a craving.  Join a support group.  Call a quit line, such as 1-800-QUIT-NOW.  Talk with your health care provider about medicines that might help you cope with cravings and make quitting easier for you.  How can I deal with withdrawal symptoms? Your body may experience negative effects as it tries to get used to not having nicotine in the system. These effects are called withdrawal symptoms. They may include:  Feeling hungrier than normal.  Trouble concentrating.  Irritability.  Trouble sleeping.  Feeling depressed.  Restlessness and agitation.  Craving a cigarette.  To manage withdrawal symptoms:  Avoid places, people, and activities that trigger your cravings.  Remember why you want to quit.  Get plenty of sleep.  Avoid coffee and other caffeinated drinks. These may worsen some of your  symptoms.  How can I handle social situations? Social situations can be difficult when you are quitting smoking, especially in the first few weeks. To manage this, you can:  Avoid parties, bars, and other social situations where people might be smoking.  Avoid alcohol.  Leave right away if you have the urge to smoke.  Explain to your family and friends that you are quitting smoking. Ask for understanding and support.  Plan activities with friends or family where smoking is not an option.  What are some ways I can cope with stress? Wanting to smoke may cause stress, and stress can make you want to smoke. Find ways to manage your stress. Relaxation techniques can help. For example:  Breathe slowly and deeply, in through your nose and out through your mouth.  Listen to soothing, relaxing music.  Talk with a family member or friend about your stress.  Light a candle.  Soak in a bath or take a shower.  Think about a peaceful place.  What are some ways I can prevent weight gain? Be aware that many people gain weight after they quit smoking. However, not everyone does. To keep from gaining weight, have a plan in place before you quit and stick to the plan after you quit. Your plan should include:  Having healthy snacks. When you have a craving, it may help to: ? Eat plain popcorn, crunchy carrots, celery, or other cut vegetables. ? Chew sugar-free gum.  Changing how you eat: ? Eat small portion sizes at meals. ?   Eat 4-6 small meals throughout the day instead of 1-2 large meals a day. ? Be mindful when you eat. Do not watch television or do other things that might distract you as you eat.  Exercising regularly: ? Make time to exercise each day. If you do not have time for a long workout, do short bouts of exercise for 5-10 minutes several times a day. ? Do some form of strengthening exercise, like weight lifting, and some form of aerobic exercise, like running or  swimming.  Drinking plenty of water or other low-calorie or no-calorie drinks. Drink 6-8 glasses of water daily, or as much as instructed by your health care provider.  Summary  Quitting smoking is a physical and mental challenge. You will face cravings, withdrawal symptoms, and temptation to smoke again. Preparation can help you as you go through these challenges.  You can cope with cravings by keeping your mouth busy (such as by chewing gum), keeping your body and hands busy, and making calls to family, friends, or a helpline for people who want to quit smoking.  You can cope with withdrawal symptoms by avoiding places where people smoke, avoiding drinks with caffeine, and getting plenty of rest.  Ask your health care provider about the different ways to prevent weight gain, avoid stress, and handle social situations. This information is not intended to replace advice given to you by your health care provider. Make sure you discuss any questions you have with your health care provider. Document Released: 10/05/2016 Document Revised: 10/05/2016 Document Reviewed: 10/05/2016 Elsevier Interactive Patient Education  2018 [employer].  Dementia Dementia is the loss of two or more brain functions, such as:  Memory.  Decision making.  Behavior.  Speaking.  Thinking.  Problem solving.  There are many types of dementia. The most common type is called progressive dementia. Progressive dementia gets worse with time and it is irreversible. An example of this type of dementia is Alzheimer disease. What are the causes? This condition may be caused by:  Nerve cell damage in the brain.  Genetic mutations.  Certain medicines.  Multiple small strokes.  An infection, such as chronic meningitis.  A metabolic problem, such as vitamin B12 deficiency or thyroid disease.  Pressure on the brain, such as from a tumor or blood clot.  What are the signs or symptoms? Symptoms of this  condition include:  Sudden changes in mood.  Depression.  Problems with balance.  Changes in personality.  Poor short-term memory.  Agitation.  Delusions.  Hallucinations.  Having a hard time: ? Speaking thoughts. ? Finding words. ? Solving problems. ? Doing familiar tasks. ? Understanding familiar ideas.  How is this diagnosed? This condition is diagnosed with an assessment by your health care provider. During this assessment, your health care provider will talk with you and your family, friends, or caregivers about your symptoms. A thorough medical history will be taken, and you will have a physical exam and tests. Tests may include:  Lab tests, such as blood or urine tests.  Imaging tests, such as a CT scan, PET scan, or MRI.  A lumbar puncture. This test involves removing and testing a small amount of the fluid that surrounds the brain and spinal cord.  An electroencephalogram (EEG). In this test, small metal discs are used to measure electrical activity in the brain.  Memory tests, cognitive tests, and neuropsychological tests. These tests evaluate brain function.  How is this treated? Treatment depends on the cause of the dementia.  It may involve taking medicines that may help:  To control the dementia.  To slow down the disease.  To manage symptoms.  In some cases, treating the cause of the dementia can improve symptoms, reverse symptoms, or slow down how quickly the dementia gets worse. Your health care provider can help direct you to support groups, organizations, and other health care providers who can help with decisions about your care. Follow these instructions at home: Medicine  Take over-the-counter and prescription medicines only as told by your health care provider.  Avoid taking medicines that can affect thinking, such as pain or sleeping medicines. Lifestyle   Make healthy lifestyle choices: ? Be physically active as told by your health  care provider. ? Do not use any tobacco products, such as cigarettes, chewing tobacco, and e-cigarettes. If you need help quitting, ask your health care provider. ? Eat a healthy diet. ? Practice stress-management techniques when you get stressed. ? Stay social.  Drink enough fluid to keep your urine clear or pale yellow.  Make sure to get quality sleep. These tips can help you to get a good night's rest: ? Avoid napping during the day. ? Keep your sleeping area dark and cool. ? Avoid exercising during the few hours before you go to bed. ? Avoid caffeine products in the evening. General instructions  Work with your health care provider to determine what you need help with and what your safety needs are.  If you were given a bracelet that tracks your location, make sure to wear it.  Keep all follow-up visits as told by your health care provider. This is important. Contact a health care provider if:  You have any new symptoms.  You have problems with choking or swallowing.  You have any symptoms of a different illness. Get help right away if:  You develop a fever.  You have new or worsening confusion.  You have new or worsening sleepiness.  You have a hard time staying awake.  You or your family members become concerned for your safety. This information is not intended to replace advice given to you by your health care provider. Make sure you discuss any questions you have with your health care provider. Document Released: 04/03/2001 Document Revised: 02/16/2016 Document Reviewed: 07/06/2015 Elsevier Interactive Patient Education  2018 [employer].  Dementia Caregiver Guide Dementia is a term used to describe a number of symptoms that affect memory and thinking. The most common symptoms include:  Memory loss.  Trouble with language and communication.  Trouble concentrating.  Poor judgment.  Problems with reasoning.  Child-like behavior and language.  Extreme  anxiety.  Angry outbursts.  Wandering from home or public places.  Dementia usually gets worse slowly over time. In the early stages, people with dementia can stay independent and safe with some help. In later stages, they need help with daily tasks such as dressing, grooming, and using the bathroom. How to help the person with dementia cope Dementia can be frightening and confusing. Here are some tips to help the person with dementia cope with changes caused by the disease. General tips  Keep the person on track with his or her routine.  Try to identify areas where the person may need help.  Be supportive, patient, calm, and encouraging.  Gently remind the person that adjusting to changes takes time.  Help with the tasks that the person has asked for help with.  Keep the person involved in daily tasks and decisions as much  as possible.  Encourage conversation, but try not to get frustrated or harried if the person struggles to find words or does not seem to appreciate your help. Communication tips  When the person is talking or seems frustrated, make eye contact and hold the person's hand.  Ask specific questions that need yes or no answers.  Use simple words, short sentences, and a calm voice. Only give one direction at a time.  When offering choices, limit them to just 1 or 2.  Avoid correcting the person in a negative way.  If the person is struggling to find the right words, gently try to help him or her. How to recognize symptoms of stress Symptoms of stress in caregivers include:  Feeling frustrated or angry with the person with dementia.  Denying that the person has dementia or that his or her symptoms will not improve.  Feeling hopeless and unappreciated.  Difficulty sleeping.  Difficulty concentrating.  Feeling anxious, irritable, or depressed.  Developing stress-related health problems.  Feeling like you have too little time for your own  life.  Follow these instructions at home:  Make sure that you and the person you are caring for: ? Get regular sleep. ? Exercise regularly. ? Eat regular, nutritious meals. ? Drink enough fluid to keep your urine clear or pale yellow. ? Take over-the-counter and prescription medicines only as told by your health care providers. ? Attend all scheduled health care appointments.  Join a support group with others who are caregivers.  Ask about respite care resources so that you can have a regular break from the stress of caregiving.  Look for signs of stress in yourself and in the person you are caring for. If you notice signs of stress, take steps to manage it.  Consider any safety risks and take steps to avoid them.  Organize medications in a pill box for each day of the week.  Create a plan to handle any legal or financial matters. Get legal or financial advice if needed.  Keep a calendar in a central location to remind the person of appointments or other activities. Tips for reducing the risk of injury  Keep floors clear of clutter. Remove rugs, magazine racks, and floor lamps.  Keep hallways well lit, especially at night.  Put a handrail and nonslip mat in the bathtub or shower.  Put childproof locks on cabinets that contain dangerous items, such as medicines, alcohol, guns, toxic cleaning items, sharp tools or utensils, matches, and lighters.  Put the locks in places where the person cannot see or reach them easily. This will help ensure that the person does not wander out of the house and get lost.  Be prepared for emergencies. Keep a list of emergency phone numbers and addresses in a convenient area.  Remove car keys and lock garage doors so that the person does not try to get in the car and drive.  Have the person wear a bracelet that tracks locations and identifies the person as having memory problems. This should be worn at all times for safety. Where to find  support: Many individuals and organizations offer support. These include:  Support groups for people with dementia and for caregivers.  Counselors or therapists.  Home health care services.  Adult day care centers.  Where to find more information: Alzheimer's Association: [employer] Contact a health care provider if:  The person's health is rapidly getting worse.  You are no longer able to care for the person.  Caring  for the person is affecting your physical and emotional health.  The person threatens himself or herself, you, or anyone else. Summary  Dementia is a term used to describe a number of symptoms that affect memory and thinking.  Dementia usually gets worse slowly over time.  Take steps to reduce the person's risk of injury, and to plan for future care.  Caregivers need support, relief from caregiving, and time for their own lives. This information is not intended to replace advice given to you by your health care provider. Make sure you discuss any questions you have with your health care provider. Document Released: 09/11/2016 Document Revised: 09/11/2016 Document Reviewed: 09/11/2016 Elsevier Interactive Patient Education  [employer]2018 Else[employer] Inc.

## 2018-01-30 ENCOUNTER — Telehealth: Payer: Self-pay | Admitting: Family Medicine

## 2018-01-30 ENCOUNTER — Other Ambulatory Visit: Payer: Self-pay | Admitting: *Deleted

## 2018-01-30 DIAGNOSIS — I10 Essential (primary) hypertension: Secondary | ICD-10-CM

## 2018-01-30 MED ORDER — LISINOPRIL 40 MG PO TABS: 40.0000 mg | ORAL_TABLET | Freq: Every day | ORAL | 0 refills | Status: AC

## 2018-01-30 NOTE — Telephone Encounter (Signed)
Copied from CRM [phone]. Topic: Quick Communication - Rx Refill/Question >> Jan 30, 2018 11:40 AM Jolayne Hainesaylor, Brittany L wrote: Medication: lisinopril (PRINIVIL,ZESTRIL) 40 MG tablet Has the patient contacted their pharmacy? yes (Agent: If no, request that the patient contact the pharmacy for the refill.) Preferred Pharmacy (with phone number or street name): [id number] - [place] ([place]), [place] - [address] Agent: Please be advised that RX refills may take up to 3 business days. We ask that you follow-up with your pharmacy.

## 2018-01-30 NOTE — Telephone Encounter (Signed)
Rx refill per protocol with no add'l RF- patient has appointment scheduled 02/24/18

## 2018-02-24 ENCOUNTER — Encounter: Payer: Self-pay | Admitting: Family Medicine

## 2018-02-24 ENCOUNTER — Ambulatory Visit: Payer: Medicare HMO | Admitting: Family Medicine

## 2018-02-24 VITALS — BP 128/80 | HR 77 | Ht 69.0 in | Wt 163.4 lb

## 2018-02-24 DIAGNOSIS — F329 Major depressive disorder, single episode, unspecified: Secondary | ICD-10-CM

## 2018-02-24 DIAGNOSIS — Z609 Problem related to social environment, unspecified: Secondary | ICD-10-CM | POA: Diagnosis not present

## 2018-02-24 DIAGNOSIS — F32A Depression, unspecified: Secondary | ICD-10-CM

## 2018-02-24 DIAGNOSIS — I7090 Unspecified atherosclerosis: Principal | ICD-10-CM

## 2018-02-24 DIAGNOSIS — Z720 Tobacco use: Secondary | ICD-10-CM

## 2018-02-24 DIAGNOSIS — F0150 Vascular dementia without behavioral disturbance: Secondary | ICD-10-CM

## 2018-02-24 DIAGNOSIS — I70209 Unspecified atherosclerosis of native arteries of extremities, unspecified extremity: Secondary | ICD-10-CM

## 2018-02-24 NOTE — Patient Instructions (Signed)
Steps to Quit Smoking Smoking tobacco can be harmful to your health and can affect almost every organ in your body. Smoking puts you, and those around you, at risk for developing many serious chronic diseases. Quitting smoking is difficult, but it is one of the best things that you can do for your health. It is never too late to quit. What are the benefits of quitting smoking? When you quit smoking, you lower your risk of developing serious diseases and conditions, such as:  Lung cancer or lung disease, such as COPD.  Heart disease.  Stroke.  Heart attack.  Infertility.  Osteoporosis and bone fractures.  Additionally, symptoms such as coughing, wheezing, and shortness of breath may get better when you quit. You may also find that you get sick less often because your body is stronger at fighting off colds and infections. If you are pregnant, quitting smoking can help to reduce your chances of having a baby of low birth weight. How do I get ready to quit? When you decide to quit smoking, create a plan to make sure that you are successful. Before you quit:  Pick a date to quit. Set a date within the next two weeks to give you time to prepare.  Write down the reasons why you are quitting. Keep this list in places where you will see it often, such as on your bathroom mirror or in your car or wallet.  Identify the people, places, things, and activities that make you want to smoke (triggers) and avoid them. Make sure to take these actions: ? Throw away all cigarettes at home, at work, and in your car. ? Throw away smoking accessories, such as ashtrays and lighters. ? Clean your car and make sure to empty the ashtray. ? Clean your home, including curtains and carpets.  Tell your family, friends, and coworkers that you are quitting. Support from your loved ones can make quitting easier.  Talk with your health care provider about your options for quitting smoking.  Find out what treatment  options are covered by your health insurance.  What strategies can I use to quit smoking? Talk with your healthcare provider about different strategies to quit smoking. Some strategies include:  Quitting smoking altogether instead of gradually lessening how much you smoke over a period of time. Research shows that quitting "cold turkey" is more successful than gradually quitting.  Attending in-person counseling to help you build problem-solving skills. You are more likely to have success in quitting if you attend several counseling sessions. Even short sessions of 10 minutes can be effective.  Finding resources and support systems that can help you to quit smoking and remain smoke-free after you quit. These resources are most helpful when you use them often. They can include: ? Online chats with a counselor. ? Telephone quitlines. ? Printed self-help materials. ? Support groups or group counseling. ? Text messaging programs. ? Mobile phone applications.  Taking medicines to help you quit smoking. (If you are pregnant or breastfeeding, talk with your health care provider first.) Some medicines contain nicotine and some do not. Both types of medicines help with cravings, but the medicines that include nicotine help to relieve withdrawal symptoms. Your health care provider may recommend: ? Nicotine patches, gum, or lozenges. ? Nicotine inhalers or sprays. ? Non-nicotine medicine that is taken by mouth.  Talk with your health care provider about combining strategies, such as taking medicines while you are also receiving in-person counseling. Using these two strategies together   makes you more likely to succeed in quitting than if you used either strategy on its own. If you are pregnant or breastfeeding, talk with your health care provider about finding counseling or other support strategies to quit smoking. Do not take medicine to help you quit smoking unless told to do so by your health care  provider. What things can I do to make it easier to quit? Quitting smoking might feel overwhelming at first, but there is a lot that you can do to make it easier. Take these important actions:  Reach out to your family and friends and ask that they support and encourage you during this time. Call telephone quitlines, reach out to support groups, or work with a counselor for support.  Ask people who smoke to avoid smoking around you.  Avoid places that trigger you to smoke, such as bars, parties, or smoke-break areas at work.  Spend time around people who do not smoke.  Lessen stress in your life, because stress can be a smoking trigger for some people. To lessen stress, try: ? Exercising regularly. ? Deep-breathing exercises. ? Yoga. ? Meditating. ? Performing a body scan. This involves closing your eyes, scanning your body from head to toe, and noticing which parts of your body are particularly tense. Purposefully relax the muscles in those areas.  Download or purchase mobile phone or tablet apps (applications) that can help you stick to your quit plan by providing reminders, tips, and encouragement. There are many free apps, such as QuitGuide from the [employer] developer for Disease Control and Prevention). You can find other support for quitting smoking (smoking cessation) through smokefree.gov and other websites.  How will I feel when I quit smoking? Within the first 24 hours of quitting smoking, you may start to feel some withdrawal symptoms. These symptoms are usually most noticeable 2-3 days after quitting, but they usually do not last beyond 2-3 weeks. Changes or symptoms that you might experience include:  Mood swings.  Restlessness, anxiety, or irritation.  Difficulty concentrating.  Dizziness.  Strong cravings for sugary foods in addition to nicotine.  Mild weight gain.  Constipation.  Nausea.  Coughing or a sore throat.  Changes in how your medicines work in your  body.  A depressed mood.  Difficulty sleeping (insomnia).  After the first 2-3 weeks of quitting, you may start to notice more positive results, such as:  Improved sense of smell and taste.  Decreased coughing and sore throat.  Slower heart rate.  Lower blood pressure.  Clearer skin.  The ability to breathe more easily.  Fewer sick days.  Quitting smoking is very challenging for most people. Do not get discouraged if you are not successful the first time. Some people need to make many attempts to quit before they achieve long-term success. Do your best to stick to your quit plan, and talk with your health care provider if you have any questions or concerns. This information is not intended to replace advice given to you by your health care provider. Make sure you discuss any questions you have with your health care provider. Document Released: 10/02/2001 Document Revised: 06/05/2016 Document Reviewed: 02/22/2015 Elsevier Interactive Patient Education  2018 [employer].  Dementia Caregiver Guide Dementia is a term used to describe a number of symptoms that affect memory and thinking. The most common symptoms include:  Memory loss.  Trouble with language and communication.  Trouble concentrating.  Poor judgment.  Problems with reasoning.  Child-like behavior and language.  Extreme anxiety.  Angry outbursts.  Wandering from home or public places.  Dementia usually gets worse slowly over time. In the early stages, people with dementia can stay independent and safe with some help. In later stages, they need help with daily tasks such as dressing, grooming, and using the bathroom. How to help the person with dementia cope Dementia can be frightening and confusing. Here are some tips to help the person with dementia cope with changes caused by the disease. General tips  Keep the person on track with his or her routine.  Try to identify areas where the person may need  help.  Be supportive, patient, calm, and encouraging.  Gently remind the person that adjusting to changes takes time.  Help with the tasks that the person has asked for help with.  Keep the person involved in daily tasks and decisions as much as possible.  Encourage conversation, but try not to get frustrated or harried if the person struggles to find words or does not seem to appreciate your help. Communication tips  When the person is talking or seems frustrated, make eye contact and hold the person's hand.  Ask specific questions that need yes or no answers.  Use simple words, short sentences, and a calm voice. Only give one direction at a time.  When offering choices, limit them to just 1 or 2.  Avoid correcting the person in a negative way.  If the person is struggling to find the right words, gently try to help him or her. How to recognize symptoms of stress Symptoms of stress in caregivers include:  Feeling frustrated or angry with the person with dementia.  Denying that the person has dementia or that his or her symptoms will not improve.  Feeling hopeless and unappreciated.  Difficulty sleeping.  Difficulty concentrating.  Feeling anxious, irritable, or depressed.  Developing stress-related health problems.  Feeling like you have too little time for your own life.  Follow these instructions at home:  Make sure that you and the person you are caring for: ? Get regular sleep. ? Exercise regularly. ? Eat regular, nutritious meals. ? Drink enough fluid to keep your urine clear or pale yellow. ? Take over-the-counter and prescription medicines only as told by your health care providers. ? Attend all scheduled health care appointments.  Join a support group with others who are caregivers.  Ask about respite care resources so that you can have a regular break from the stress of caregiving.  Look for signs of stress in yourself and in the person you are  caring for. If you notice signs of stress, take steps to manage it.  Consider any safety risks and take steps to avoid them.  Organize medications in a pill box for each day of the week.  Create a plan to handle any legal or financial matters. Get legal or financial advice if needed.  Keep a calendar in a central location to remind the person of appointments or other activities. Tips for reducing the risk of injury  Keep floors clear of clutter. Remove rugs, magazine racks, and floor lamps.  Keep hallways well lit, especially at night.  Put a handrail and nonslip mat in the bathtub or shower.  Put childproof locks on cabinets that contain dangerous items, such as medicines, alcohol, guns, toxic cleaning items, sharp tools or utensils, matches, and lighters.  Put the locks in places where the person cannot see or reach them easily. This will help ensure that the  person does not wander out of the house and get lost.  Be prepared for emergencies. Keep a list of emergency phone numbers and addresses in a convenient area.  Remove car keys and lock garage doors so that the person does not try to get in the car and drive.  Have the person wear a bracelet that tracks locations and identifies the person as having memory problems. This should be worn at all times for safety. Where to find support: Many individuals and organizations offer support. These include:  Support groups for people with dementia and for caregivers.  Counselors or therapists.  Home health care services.  Adult day care centers.  Where to find more information: Alzheimer's Association: LimitLaws.hu Contact a health care provider if:  The person's health is rapidly getting worse.  You are no longer able to care for the person.  Caring for the person is affecting your physical and emotional health.  The person threatens himself or herself, you, or anyone else. Summary  Dementia is a term used to describe a  number of symptoms that affect memory and thinking.  Dementia usually gets worse slowly over time.  Take steps to reduce the person's risk of injury, and to plan for future care.  Caregivers need support, relief from caregiving, and time for their own lives. This information is not intended to replace advice given to you by your health care provider. Make sure you discuss any questions you have with your health care provider. Document Released: 09/11/2016 Document Revised: 09/11/2016 Document Reviewed: 09/11/2016 Elsevier Interactive Patient Education  [employer].

## 2018-02-24 NOTE — Progress Notes (Signed)
Subjective:  Patient ID: Ryan Brady, male    DOB: 05-04-1946  Age: 72 y.o. MRN: [id number]  CC: Follow-up   HPI Ryan Brady presents for follow-up status post decreasing dose of Paxil from 40 mg to 20 mg.  Patient is accompanied by his son.  Patient is doing much better on the lower dose of Paxil.  He had been placed on the higher dose because he lost 3 close family members in the space of months.  It is been a little over a year since the last family member passed and he is in a better place.  Son feels like patient's mentation is improved on the lower dose and it has been easier to have conversations with him.  Patient has a male neighbor who comes in and helps him clean and cleans his close.  She with other neighbors actually bring him food on a regular basis.  Patient's son and his daughter-in-law help him with his medicines and food.  Son is hoping that the family could have some help with their father's care.  Outpatient Medications Prior to Visit  Medication Sig Dispense Refill  . amLODipine (NORVASC) 5 MG tablet TAKE ONE TABLET BY MOUTH ONCE DAILY 90 tablet 1  . lisinopril (PRINIVIL,ZESTRIL) 40 MG tablet Take 1 tablet (40 mg total) by mouth daily. 90 tablet 0  . PARoxetine (PAXIL) 20 MG tablet Take 1 tablet (20 mg total) by mouth daily. 30 tablet 1  . pravastatin (PRAVACHOL) 20 MG tablet Take 1 tablet (20 mg total) by mouth daily. Yearly physical w/labs due in April must see MD for refills 30 tablet 1  . clopidogrel (PLAVIX) 75 MG tablet TAKE ONE TABLET BY MOUTH ONCE DAILY WITH  BREAKFAST 30 tablet 1  . Multiple Vitamin (MULTI VITAMIN DAILY PO) Take 1 capsule by mouth every morning.     No facility-administered medications prior to visit.     ROS Review of Systems  Constitutional: Negative.   HENT: Negative.   Eyes: Negative.   Respiratory: Negative.   Cardiovascular: Negative.   Gastrointestinal: Negative.   Endocrine: Negative for polyphagia and polyuria.  Neurological:  Negative.   Hematological: Negative.   Psychiatric/Behavioral: Positive for confusion and decreased concentration.    Objective:  BP 128/80   Pulse 77   Ht  (1.753 m)   Wt 163 lb 6 oz (74.1 kg)   SpO2 98%   BMI 24.13 kg/m   BP Readings from Last 3 Encounters:  02/24/18 128/80  01/23/18 134/80  11/16/16 (!) 142/90    Wt Readings from Last 3 Encounters:  02/24/18 163 lb 6 oz (74.1 kg)  01/23/18 166 lb 6 oz (75.5 kg)  11/05/16 177 lb 12.8 oz (80.6 kg)    Physical Exam  Constitutional: He appears well-developed and well-nourished. No distress.  HENT:  Head: Normocephalic and atraumatic.  Right Ear: External ear normal.  Left Ear: External ear normal.  Eyes: Right eye exhibits no discharge. Left eye exhibits no discharge. No scleral icterus.  Pulmonary/Chest: Effort normal.  Neurological: He is alert.  Skin: Skin is warm and dry. He is not diaphoretic.  Psychiatric: He has a normal mood and affect. His behavior is normal.    Lab Results  Component Value Date   WBC 11.5 (H) 01/31/2016   HGB 13.1 01/31/2016   HCT 39.8 01/31/2016   PLT 315.0 01/31/2016   GLUCOSE 93 01/31/2016   CHOL 159 01/31/2016   TRIG 56.0 01/31/2016   HDL 38.50 (L) 01/31/2016  LDLCALC 109 (H) 01/31/2016   ALT 12 01/31/2016   AST 13 01/31/2016   NA 141 01/31/2016   K 3.9 01/31/2016   CL 106 01/31/2016   CREATININE 1.31 01/31/2016   BUN 16 01/31/2016   CO2 29 01/31/2016   TSH 1.78 01/15/2014   PSA 5.18 (H) 01/15/2014   INR 1.08 04/24/2013   HGBA1C 5.6 04/25/2013    Ct Head Wo Contrast  Result Date: 08/22/2013 CLINICAL DATA:  Altered mental status. Recent stroke. EXAM: CT HEAD WITHOUT CONTRAST TECHNIQUE: Contiguous axial images were obtained from the base of the skull through the vertex without intravenous contrast. COMPARISON:  04/25/2013 and 04/24/2013 FINDINGS: There is no evidence of intracranial hemorrhage, brain edema, or other signs of acute infarction. There is no evidence of  intracranial mass lesion or mass effect. No abnormal extraaxial fluid collections are identified. A subacute to chronic lacunar infarct is seen in the right basal ganglia, as noted on recent MRI. Other old bilateral basal ganglia lacunes and chronic small vessel disease are also stable in appearance. Mild cerebral atrophy and ventriculomegaly are also unchanged. IMPRESSION: No acute intracranial findings. Subacute to chronic right basal ganglia lacunar infarct, as demonstrated on recent MRI. Other old basal ganglia lacunae, chronic small vessel disease, and cerebral atrophy are also stable in appearance. Electronically Signed   By: Myles Rosenthal M.D.   On: 08/22/2013 13:34    Assessment & Plan:   Yuto was seen today for follow-up.  Diagnoses and all orders for this visit:  Multi-infarct dementia due to atherosclerosis [facility]) -     Ambulatory referral to Home Health  Tobacco abuse -     Ambulatory referral to Home Health  Depression, unspecified depression type -     Ambulatory referral to Home Health  High risk social situation -     Ambulatory referral to Home Health   I have discontinued Santino Anacker's Multiple Vitamin (MULTI VITAMIN DAILY PO) and clopidogrel. I am also having him maintain his amLODipine, PARoxetine, pravastatin, and lisinopril.  No orders of the defined types were placed in this encounter.  Indeed patient does seem brighter and with more present status post decreasing Paxil dosage.  I remain highly concerned about his independent living status with dementia and tobacco abuse.  We will bring him visiting home health nurse.  Suggested that the son by the patient a vape pin to replace cigarettes and remove cigarettes from the home.  Follow-up: Return in about 3 months (around 05/27/2018).  Mliss Sax, MD

## 2018-03-06 ENCOUNTER — Other Ambulatory Visit: Payer: Self-pay

## 2018-03-06 DIAGNOSIS — E785 Hyperlipidemia, unspecified: Secondary | ICD-10-CM

## 2018-03-06 MED ORDER — PRAVASTATIN SODIUM 20 MG PO TABS: 20.0000 mg | ORAL_TABLET | Freq: Every day | ORAL | 1 refills | Status: AC

## 2018-03-12 ENCOUNTER — Telehealth: Payer: Self-pay | Admitting: Family Medicine

## 2018-03-12 NOTE — Telephone Encounter (Signed)
Copied from CRM [id number]. Topic: Inquiry >> Mar 12, 2018  3:36 PM Crist Infante wrote: Reason for CRM: Jeanice Lim with [facility] states they will start pt's home health tomorrow (instead of today, per pt request)

## 2018-03-12 NOTE — Telephone Encounter (Signed)
fyi

## 2018-03-14 ENCOUNTER — Telehealth: Payer: Self-pay

## 2018-03-14 NOTE — Telephone Encounter (Signed)
FYI - patient was seeking personal care services which [facility] cannot do.

## 2018-03-14 NOTE — Telephone Encounter (Signed)
Copied from CRM [id number]. Topic: Inquiry >> Mar 12, 2018  3:36 PM Crist Infante wrote: Reason for CRM: Jeanice Lim with [facility] states they will start pt's home health tomorrow (instead of today, per pt request)  >> Mar 14, 2018 10:09 AM Elliot Gault wrote: Caller name: Holly Relation to pt: RN from [facility]  Call back number: [phone]   Reason for call:  Home health aid is not needed patient seeking personal care services, RN informed patient a list of services

## 2018-05-13 DIAGNOSIS — I469 Cardiac arrest, cause unspecified: Secondary | ICD-10-CM | POA: Diagnosis not present

## 2018-05-13 DIAGNOSIS — R4020 Unspecified coma: Secondary | ICD-10-CM | POA: Diagnosis not present

## 2018-05-14 ENCOUNTER — Telehealth: Payer: Self-pay

## 2018-05-14 NOTE — Telephone Encounter (Signed)
Copied from CRM [id number]. Topic: General - Deceased Patient >> May 14, 2018  2:10 PM Gaynelle AduPoole, Shalonda wrote: Reason for CRM: Patient daughter in law called in to advise the patient has passed away on December 04, 2017 please advise   Route to department's PEC Pool.

## 2018-05-16 NOTE — Telephone Encounter (Signed)
Please re-fax if you haven't already faxed the death certificate back to the funeral home. Their fax was down. Please re-fax. Fax:[phone]

## 2018-05-16 NOTE — Telephone Encounter (Signed)
Front office will re-fax death certificate fax page. The person who came to the office with the certificate has the original.

## 2018-05-22 DIAGNOSIS — 419620001 Death: Secondary | SNOMED CT | POA: Diagnosis not present

## 2018-05-22 DEATH — deceased

## 2018-05-27 ENCOUNTER — Ambulatory Visit: Payer: Medicare HMO | Admitting: Family Medicine
